# Patient Record
Sex: Female | Born: 1985 | Race: Black or African American | Hispanic: No | Marital: Single | Smoking: Never smoker
Health system: Southern US, Community
[De-identification: ages and names within clinical notes are randomized; demographics above are authoritative.]

## PROBLEM LIST (undated history)

## (undated) DIAGNOSIS — L309 Dermatitis, unspecified: Secondary | ICD-10-CM

## (undated) DIAGNOSIS — F419 Anxiety disorder, unspecified: Secondary | ICD-10-CM

## (undated) HISTORY — PX: NO PAST SURGERIES: SHX2092

## (undated) HISTORY — DX: Dermatitis, unspecified: L30.9

---

## 2009-01-14 ENCOUNTER — Inpatient Hospital Stay (HOSPITAL_COMMUNITY): Admission: AD | Admit: 2009-01-14 | Discharge: 2009-01-17 | Payer: Self-pay | Admitting: Obstetrics and Gynecology

## 2009-01-15 ENCOUNTER — Encounter (INDEPENDENT_AMBULATORY_CARE_PROVIDER_SITE_OTHER): Payer: Self-pay | Admitting: Obstetrics

## 2010-10-09 LAB — CBC
HCT: 34.4 % — ABNORMAL LOW (ref 36.0–46.0)
Hemoglobin: 10.7 g/dL — ABNORMAL LOW (ref 12.0–15.0)
Hemoglobin: 8.9 g/dL — ABNORMAL LOW (ref 12.0–15.0)
MCV: 74.6 fL — ABNORMAL LOW (ref 78.0–100.0)
Platelets: 256 10*3/uL (ref 150–400)
RBC: 3.62 MIL/uL — ABNORMAL LOW (ref 3.87–5.11)
RDW: 17.1 % — ABNORMAL HIGH (ref 11.5–15.5)
WBC: 17.2 10*3/uL — ABNORMAL HIGH (ref 4.0–10.5)
WBC: 22.1 10*3/uL — ABNORMAL HIGH (ref 4.0–10.5)

## 2015-01-06 ENCOUNTER — Emergency Department (HOSPITAL_COMMUNITY)
Admission: EM | Admit: 2015-01-06 | Discharge: 2015-01-06 | Disposition: A | Discharge status: Home / Self Care | Payer: BLUE CROSS/BLUE SHIELD | Attending: Emergency Medicine | Admitting: Emergency Medicine

## 2015-01-06 DIAGNOSIS — R21 Rash and other nonspecific skin eruption: Secondary | ICD-10-CM | POA: Diagnosis present

## 2015-01-06 DIAGNOSIS — L259 Unspecified contact dermatitis, unspecified cause: Secondary | ICD-10-CM | POA: Insufficient documentation

## 2015-01-06 MED ORDER — PREDNISONE 10 MG PO TABS: ORAL_TABLET | ORAL | Status: DC

## 2015-01-06 NOTE — Discharge Instructions (Signed)
Contact Dermatitis °Contact dermatitis is a rash that happens when something touches the skin. You touched something that irritates your skin, or you have allergies to something you touched. °HOME CARE  °· Avoid the thing that caused your rash. °· Keep your rash away from hot water, soap, sunlight, chemicals, and other things that might bother it. °· Do not scratch your rash. °· You can take cool baths to help stop itching. °· Only take medicine as told by your doctor. °· Keep all doctor visits as told. °GET HELP RIGHT AWAY IF:  °· Your rash is not better after 3 days. °· Your rash gets worse. °· Your rash is puffy (swollen), tender, red, sore, or warm. °· You have problems with your medicine. °MAKE SURE YOU:  °· Understand these instructions. °· Will watch your condition. °· Will get help right away if you are not doing well or get worse. °Document Released: 04/16/2009 Document Revised: 09/11/2011 Document Reviewed: 11/22/2010 °ExitCare® Patient Information ©2015 [employer]. This information is not intended to replace advice given to you by your health care provider. Make sure you discuss any questions you have with your health care provider. ° °

## 2015-01-06 NOTE — ED Notes (Signed)
Pt c/o bumps on skin onset last Thursday, itching started in past 24 hours. Pt states that the rash began on her forehead, spread to her face, chest, and back, and are now on her hands as well. Pt states she attempted treatment with an oatmeal shower and 2 benadryl pills last night and hydrocortisone cream today.

## 2015-01-06 NOTE — ED Notes (Signed)
Pt escorted to discharge window. Pt verbalized understanding discharge instructions. In no acute distress.  

## 2015-01-06 NOTE — ED Provider Notes (Signed)
CSN: [id number]     Arrival date & time 01/06/15  1419 History  This chart was scribed for non-physician practitioner, Teressa LowerVrinda Hilary Milks, NP, working with Raeford RazorStephen Kohut, MD by Charline BillsEssence Howell, ED Scribe. This patient was seen in room WTR5/WTR5 and the patient's care was started at 2:46 PM.   Chief Complaint  Patient presents with  . Allergic Reaction   The history is provided by the patient. No language interpreter was used.   HPI Comments: Molly LucksJessica Delossantos is a 29 y.o. female who presents to the Emergency Department complaining of a gradually improving rash to her face, chest, back and fingers for the past 6 days. Pt states that rash started on her forehead 6 days ago but did not become pruritic until yesterday. She reports using a new makeup spray 7-8 days ago. Pt has tried cleansing her face with [place], 2 Benadryl tablets last night and hydrocortisone cream with mild relief. No h/o DM.  No past medical history on file. No past surgical history on file. No family history on file. History  Substance Use Topics  . Smoking status: Not on file  . Smokeless tobacco: Not on file  . Alcohol Use: Not on file   OB History    No data available     Review of Systems  Skin: Positive for rash.  All other systems reviewed and are negative.  Allergies  Review of patient's allergies indicates not on file.  Home Medications   Prior to Admission medications   Not on File   BP 117/62 mmHg  Pulse 76  Temp(Src) 98.3 F (36.8 C) (Oral)  Resp 18  SpO2 100% Physical Exam  Constitutional: She is oriented to person, place, and time. She appears well-developed and well-nourished. No distress.  HENT:  Head: Normocephalic and atraumatic.  Mouth/Throat: Oropharynx is clear and moist and mucous membranes are normal.  No oral mucosal swelling.  Eyes: Conjunctivae and EOM are normal.  Neck: Neck supple. No tracheal deviation present.  Cardiovascular: Normal rate.   Pulmonary/Chest: Effort normal  and breath sounds normal. No respiratory distress.  Lungs are clear to auscultation.   Musculoskeletal: Normal range of motion.  Neurological: She is alert and oriented to person, place, and time.  Skin: Skin is warm and dry. Rash noted. Rash is macular and papular.  Macular, papular rash to face, neck and chest.  Psychiatric: She has a normal mood and affect. Her behavior is normal.  Nursing note and vitals reviewed.  ED Course  Procedures (including critical care time) DIAGNOSTIC STUDIES: Oxygen Saturation is 100% on RA, normal by my interpretation.    COORDINATION OF CARE: 2:49 PM-Discussed treatment plan which includes Prednisone with pt at bedside and pt agreed to plan.   Labs Review Labs Reviewed - No data to display  Imaging Review No results found.   EKG Interpretation None      MDM   Final diagnoses:  Contact dermatitis    Will treat with prednisone. No oral swelling or breathing difficulty. Pt given return precautions  I personally performed the services described in this documentation, which was scribed in my presence. The recorded information has been reviewed and is accurate.    Teressa LowerVrinda Dayanara Sherrill, NP 01/06/15 1457  Raeford RazorStephen Kohut, MD 01/07/15 [phone]

## 2016-03-10 LAB — CYTOLOGY - PAP

## 2016-03-16 ENCOUNTER — Ambulatory Visit: Payer: Self-pay | Admitting: Obstetrics & Gynecology

## 2016-03-30 ENCOUNTER — Encounter: Payer: Self-pay | Admitting: Obstetrics and Gynecology

## 2016-03-30 ENCOUNTER — Ambulatory Visit (INDEPENDENT_AMBULATORY_CARE_PROVIDER_SITE_OTHER): Payer: 59 | Admitting: Obstetrics and Gynecology

## 2016-03-30 VITALS — BP 108/65 | HR 92 | Temp 98.6°F | Wt 153.2 lb

## 2016-03-30 DIAGNOSIS — Z01419 Encounter for gynecological examination (general) (routine) without abnormal findings: Secondary | ICD-10-CM

## 2016-03-30 DIAGNOSIS — Z3049 Encounter for surveillance of other contraceptives: Secondary | ICD-10-CM | POA: Diagnosis not present

## 2016-03-30 DIAGNOSIS — Z30011 Encounter for initial prescription of contraceptive pills: Secondary | ICD-10-CM

## 2016-03-30 MED ORDER — NORETHIN ACE-ETH ESTRAD-FE 1-20 MG-MCG(24) PO TABS: 1.0000 | ORAL_TABLET | Freq: Every day | ORAL | 12 refills | Status: DC

## 2016-03-30 NOTE — Progress Notes (Signed)
Subjective:     Molly LucksJessica Dawson is a 30 y.o. female who is here for a comprehensive physical exam. The patient reports no problems. Patient is sexually active using OCP for contraception. She denies any abnormal vaginal bleeding or discharge  No past medical history on file. No past surgical history on file. No family history on file.  Social History   Social History  . Marital status: Single    Spouse name: N/A  . Number of children: N/A  . Years of education: N/A   Occupational History  . Not on file.   Social History Main Topics  . Smoking status: Never Smoker  . Smokeless tobacco: Not on file  . Alcohol use 0.6 oz/week    1 Glasses of wine per week  . Drug use: No  . Sexual activity: Yes    Partners: Female, Female    Birth control/ protection: Pill   Other Topics Concern  . Not on file   Social History Narrative  . No narrative on file   Health Maintenance  Topic Date Due  . HIV Screening  10/22/2001  . TETANUS/TDAP  10/22/2005  . INFLUENZA VACCINE  02/01/2016  . PAP SMEAR  04/12/2018       Review of Systems Pertinent items are noted in HPI.   Objective:  Blood pressure 108/65, pulse 92, temperature 98.6 F (37 C), temperature source Oral, weight 153 lb 3.2 oz (69.5 kg).     GENERAL: Well-developed, well-nourished female in no acute distress.  HEENT: Normocephalic, atraumatic. Sclerae anicteric.  NECK: Supple. Normal thyroid.  LUNGS: Clear to auscultation bilaterally.  HEART: Regular rate and rhythm. BREASTS: Symmetric in size. No palpable masses or lymphadenopathy, skin changes, or nipple drainage. ABDOMEN: Soft, nontender, nondistended. No organomegaly. PELVIC: Normal external female genitalia. Vagina is pink and rugated.  Normal discharge. Normal appearing cervix. Uterus is normal in size. No adnexal mass or tenderness. EXTREMITIES: No cyanosis, clubbing, or edema, 2+ distal pulses.    Assessment:    Healthy female exam.      Plan:    pap  smear collected Patient desires full STD testing which was performed today Refill on OCP provided Patient will be contacted with abnormal results See After Visit Summary for Counseling Recommendations

## 2016-03-30 NOTE — Progress Notes (Signed)
Patient is in office for initial gyn visit, states that she wants to continue taking Lomedia birth control pills.

## 2016-03-31 ENCOUNTER — Encounter: Payer: Self-pay | Admitting: Obstetrics and Gynecology

## 2016-03-31 LAB — HIV ANTIBODY (ROUTINE TESTING W REFLEX): HIV SCREEN 4TH GENERATION: NONREACTIVE

## 2016-03-31 LAB — RPR: RPR: NONREACTIVE

## 2016-03-31 LAB — HEPATITIS C ANTIBODY: Hep C Virus Ab: <0.1 s/co ratio (ref 0.0–0.9)

## 2016-04-03 LAB — GC/CHLAMYDIA PROBE AMP
CHLAMYDIA, DNA PROBE: NEGATIVE
NEISSERIA GONORRHOEAE BY PCR: NEGATIVE

## 2016-04-05 LAB — PAP IG W/ RFLX HPV ASCU: PAP SMEAR COMMENT: 0

## 2016-04-17 ENCOUNTER — Other Ambulatory Visit: Payer: Self-pay | Admitting: *Deleted

## 2016-04-17 DIAGNOSIS — Z3041 Encounter for surveillance of contraceptive pills: Secondary | ICD-10-CM

## 2016-04-17 MED ORDER — NORETHIN-ETH ESTRAD-FE BIPHAS 1 MG-10 MCG / 10 MCG PO TABS: 1.0000 | ORAL_TABLET | Freq: Every day | ORAL | 4 refills | Status: DC

## 2016-04-17 NOTE — Progress Notes (Unsigned)
loloestrin

## 2016-06-08 ENCOUNTER — Encounter (HOSPITAL_COMMUNITY): Payer: Self-pay | Admitting: Family Medicine

## 2016-06-08 ENCOUNTER — Ambulatory Visit (HOSPITAL_COMMUNITY)
Admission: EM | Admit: 2016-06-08 | Discharge: 2016-06-08 | Disposition: A | Discharge status: Home / Self Care | Payer: 59 | Attending: Family Medicine | Admitting: Family Medicine

## 2016-06-08 DIAGNOSIS — J0100 Acute maxillary sinusitis, unspecified: Secondary | ICD-10-CM

## 2016-06-08 MED ORDER — AMOXICILLIN 875 MG PO TABS
875.0000 mg | ORAL_TABLET | Freq: Two times a day (BID) | ORAL | 0 refills | Status: DC
Start: 2016-06-08 — End: 2017-01-04

## 2016-06-08 MED ORDER — FLUTICASONE PROPIONATE 50 MCG/ACT NA SUSP: 1.0000 | Freq: Every day | NASAL | 2 refills | Status: DC

## 2016-06-08 NOTE — ED Provider Notes (Signed)
[facility]    CSN: [id number] Arrival date & time: 06/08/16  1013     History   Chief Complaint Chief Complaint  Patient presents with  . Nasal Congestion    HPI Molly Dawson is a 30 y.o. female.   This is 30 year old woman comes in for evaluation of sinus congestion. She's had 3 weeks of symptoms which have gotten worse over the last 3 days. Patient has had no fever or cough. She does have a dry mouth sometimes.  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      History reviewed. No pertinent past medical history.  There are no active problems to display for this patient.   History reviewed. No pertinent surgical history.  OB History    No data available       Home Medications    Prior to Admission medications   Medication Sig Start Date End Date Taking? Authorizing Provider  amoxicillin (AMOXIL) 875 MG tablet Take 1 tablet (875 mg total) by mouth 2 (two) times daily. Take with food 06/08/16   Elvina SidleKurt Daviona Herbert, MD  fluticasone [facility](FLONASE) 50 MCG/ACT nasal spray Place 1 spray into both nostrils daily. 06/08/16   Elvina SidleKurt Kaylan Friedmann, MD  Norethindrone Acetate-Ethinyl Estrad-FE (LOMEDIA 24 FE) 1-20 MG-MCG(24) tablet Take 1 tablet by mouth daily. 03/30/16   Peggy Constant, MD  Norethindrone-Ethinyl Estradiol-Fe Biphas (LO LOESTRIN FE) 1 MG-10 MCG / 10 MCG tablet Take 1 tablet by mouth daily. 04/17/16   Roe Coombsachelle A Denney, CNM    Family History History reviewed. No pertinent family history.  Social History Social History  Substance Use Topics  . Smoking status: Never Smoker  . Smokeless tobacco: Never Used  . Alcohol use 0.6 oz/week    1 Glasses of wine per week     Allergies   Patient has no known allergies.   Review of Systems Review of Systems  Constitutional: Negative.   HENT: Positive for congestion, rhinorrhea, sinus pain, sinus pressure, sneezing and sore throat.   Respiratory: Negative.   Cardiovascular: Negative.     Musculoskeletal: Negative.   Neurological: Negative.      Physical Exam Triage Vital Signs ED Triage Vitals [06/08/16 1103]  Enc Vitals Group     BP 114/74     Pulse Rate 85     Resp 18     Temp 98.3 F (36.8 C)     Temp Source Oral     SpO2 100 %     Weight      Height      Head Circumference      Peak Flow      Pain Score      Pain Loc      Pain Edu?      Excl. in GC?    No data found.   Updated Vital Signs BP 114/74   Pulse 85   Temp 98.3 F (36.8 C) (Oral)   Resp 18   SpO2 100%    Physical Exam  Constitutional: She is oriented to person, place, and time. She appears well-developed and well-nourished.  HENT:  Head: Normocephalic.  Right Ear: External ear normal.  Left Ear: External ear normal.  Mouth/Throat: Oropharynx is clear and moist.  Eyes: Conjunctivae and EOM are normal.  Neck: Normal range of motion. Neck supple.  Pulmonary/Chest: Effort normal.  Musculoskeletal: Normal range of motion.  Neurological: She is alert and oriented to person, place, and time.  Skin: Skin is warm and  dry.  Nursing note and vitals reviewed.    UC Treatments / Results  Labs (all labs ordered are listed, but only abnormal results are displayed) Labs Reviewed - No data to display  EKG  EKG Interpretation None       Radiology No results found.  Procedures Procedures (including critical care time)  Medications Ordered in UC Medications - No data to display   Initial Impression / Assessment and Plan / UC Course  I have reviewed the triage vital signs and the nursing notes.  Pertinent labs & imaging results that were available during my care of the patient were reviewed by me and considered in my medical decision making (see chart for details).  Clinical Course     Final Clinical Impressions(s) / UC Diagnoses   Final diagnoses:  Acute maxillary sinusitis, recurrence not specified    New Prescriptions New Prescriptions   AMOXICILLIN (AMOXIL)  875 MG TABLET    Take 1 tablet (875 mg total) by mouth 2 (two) times daily. Take with food   FLUTICASONE (FLONASE) 50 MCG/ACT NASAL SPRAY    Place 1 spray into both nostrils daily.     Elvina SidleKurt Herschel Fleagle, MD 06/08/16 1124

## 2016-06-08 NOTE — ED Triage Notes (Signed)
Pt here for severe nasal congestion. sts that she has been having symptoms for 3 weeks but started worsening Monday.

## 2016-06-08 NOTE — Discharge Instructions (Signed)
You may use Afrin (or generic oxymetazoline) nasal spray for 2-3 days to get more immediate relief

## 2016-06-09 NOTE — Progress Notes (Deleted)
Discharge instructions, RX's and follow up appts explained and provided to patient, verbalized understanding. Patient left floor via wheelchair accompanied by staff no c/o pain or shortness of breath.  Curley Hogen, Kae HellerMiranda Lynn, RN

## 2016-07-13 ENCOUNTER — Other Ambulatory Visit: Payer: Self-pay | Admitting: *Deleted

## 2016-07-13 DIAGNOSIS — Z308 Encounter for other contraceptive management: Secondary | ICD-10-CM

## 2016-07-13 MED ORDER — NORETHIN ACE-ETH ESTRAD-FE 1-20 MG-MCG PO TABS: 1.0000 | ORAL_TABLET | Freq: Every day | ORAL | 11 refills | Status: DC

## 2016-07-13 NOTE — Progress Notes (Unsigned)
Pt called to office needing to change her BC to Junel 1-20. Pt states that her insurance has an increased copay for current Rx. BC was changed to Junel per Dr Clearance CootsHarper approval.

## 2016-08-23 ENCOUNTER — Encounter (HOSPITAL_COMMUNITY): Payer: Self-pay | Admitting: Emergency Medicine

## 2016-08-23 ENCOUNTER — Ambulatory Visit (HOSPITAL_COMMUNITY)
Admission: EM | Admit: 2016-08-23 | Discharge: 2016-08-23 | Disposition: A | Discharge status: Home / Self Care | Payer: 59 | Attending: Family Medicine | Admitting: Family Medicine

## 2016-08-23 DIAGNOSIS — J0100 Acute maxillary sinusitis, unspecified: Secondary | ICD-10-CM

## 2016-08-23 MED ORDER — IPRATROPIUM BROMIDE 0.06 % NA SOLN: 2.0000 | Freq: Four times a day (QID) | NASAL | 0 refills | Status: DC

## 2016-08-23 MED ORDER — AMOXICILLIN 875 MG PO TABS: 875.0000 mg | ORAL_TABLET | Freq: Two times a day (BID) | ORAL | 0 refills | Status: DC

## 2016-08-23 NOTE — ED Triage Notes (Signed)
Pt reports sinus congestion and drainage since Sunday.  She reports facial pain and a slight headache.

## 2016-08-23 NOTE — ED Provider Notes (Signed)
CSN: [id number]     Arrival date & time 08/23/16  1038 History   None    Chief Complaint  Patient presents with  . Recurrent Sinusitis   (Consider location/radiation/quality/duration/timing/severity/associated sxs/prior Treatment) Patient c/o sinus drainage fever and congestion for 3 days   The history is provided by the patient.  URI  Presenting symptoms: congestion, cough, fatigue and fever   Severity:  Moderate Onset quality:  Sudden Duration:  3 days Progression:  Worsening Chronicity:  New Relieved by:  None tried Worsened by:  Nothing Ineffective treatments:  None tried Associated symptoms: headaches     History reviewed. No pertinent past medical history. History reviewed. No pertinent surgical history. History reviewed. No pertinent family history. Social History  Substance Use Topics  . Smoking status: Never Smoker  . Smokeless tobacco: Never Used  . Alcohol use 0.6 oz/week    1 Glasses of wine per week   OB History    No data available     Review of Systems  Constitutional: Positive for fatigue and fever.  HENT: Positive for congestion.   Eyes: Negative.   Respiratory: Positive for cough.   Cardiovascular: Negative.   Gastrointestinal: Negative.   Endocrine: Negative.   Genitourinary: Negative.   Musculoskeletal: Negative.   Allergic/Immunologic: Negative.   Neurological: Positive for headaches.  Hematological: Negative.   Psychiatric/Behavioral: Negative.     Allergies  Patient has no known allergies.  Home Medications   Prior to Admission medications   Medication Sig Start Date End Date Taking? Authorizing Provider  norethindrone-ethinyl estradiol (JUNEL FE 1/20) 1-20 MG-MCG tablet Take 1 tablet by mouth daily. 07/13/16  Yes Brock Badharles A Harper, MD  amoxicillin (AMOXIL) 875 MG tablet Take 1 tablet (875 mg total) by mouth 2 (two) times daily. Take with food 06/08/16   Elvina SidleKurt Lauenstein, MD  amoxicillin (AMOXIL) 875 MG tablet Take 1 tablet (875 mg  total) by mouth 2 (two) times daily. 08/23/16   Deatra CanterWilliam J Oxford, FNP  fluticasone (FLONASE) 50 MCG/ACT nasal spray Place 1 spray into both nostrils daily. 06/08/16   Elvina SidleKurt Lauenstein, MD  ipratropium (ATROVENT) 0.06 % nasal spray Place 2 sprays into both nostrils 4 (four) times daily. 08/23/16   Deatra CanterWilliam J Oxford, FNP  ipratropium (ATROVENT) 0.06 % nasal spray Place 2 sprays into both nostrils 4 (four) times daily. 08/23/16   Deatra CanterWilliam J Oxford, FNP  Norethindrone-Ethinyl Estradiol-Fe Biphas (LO LOESTRIN FE) 1 MG-10 MCG / 10 MCG tablet Take 1 tablet by mouth daily. 04/17/16   Roe Coombsachelle A Denney, CNM   Meds Ordered and Administered this Visit  Medications - No data to display  BP 111/69 (BP Location: Right Arm)   Pulse 82   Temp 98.1 F (36.7 C) (Oral)   SpO2 100%  No data found.   Physical Exam  Constitutional: She appears well-developed and well-nourished.  HENT:  Head: Normocephalic and atraumatic.  Right Ear: External ear normal.  Left Ear: External ear normal.  Mouth/Throat: Oropharynx is clear and moist.  Eyes: Conjunctivae and EOM are normal. Pupils are equal, round, and reactive to light.  Neck: Normal range of motion. Neck supple.  Cardiovascular: Normal rate, regular rhythm and normal heart sounds.   Pulmonary/Chest: Effort normal and breath sounds normal.  Nursing note and vitals reviewed.   Urgent Care Course     Procedures (including critical care time)  Labs Review Labs Reviewed - No data to display  Imaging Review No results found.   Visual Acuity Review  Right Eye Distance:  Left Eye Distance:   Bilateral Distance:    Right Eye Near:   Left Eye Near:    Bilateral Near:         MDM   1. Subacute maxillary sinusitis    Amoxicillin 875mg  one po bid x10 days #20 Atrovent Nasal Spray  Push po fluids, rest, tylenol and motrin otc prn as directed for fever, arthralgias, and myalgias.  Follow up prn if sx's continue or persist.   Deatra Canter,  FNP 08/23/16 1336

## 2016-09-12 ENCOUNTER — Ambulatory Visit: Payer: BLUE CROSS/BLUE SHIELD | Admitting: Family

## 2017-01-04 ENCOUNTER — Other Ambulatory Visit (HOSPITAL_COMMUNITY)
Admission: RE | Admit: 2017-01-04 | Discharge: 2017-01-04 | Disposition: A | Discharge status: Home / Self Care | Payer: 59 | Source: Ambulatory Visit | Attending: Advanced Practice Midwife | Admitting: Advanced Practice Midwife

## 2017-01-04 ENCOUNTER — Ambulatory Visit (INDEPENDENT_AMBULATORY_CARE_PROVIDER_SITE_OTHER): Payer: 59 | Admitting: Advanced Practice Midwife

## 2017-01-04 ENCOUNTER — Encounter: Payer: Self-pay | Admitting: Advanced Practice Midwife

## 2017-01-04 DIAGNOSIS — N762 Acute vulvitis: Secondary | ICD-10-CM | POA: Diagnosis not present

## 2017-01-04 DIAGNOSIS — Z113 Encounter for screening for infections with a predominantly sexual mode of transmission: Secondary | ICD-10-CM

## 2017-01-04 DIAGNOSIS — N9089 Other specified noninflammatory disorders of vulva and perineum: Secondary | ICD-10-CM | POA: Insufficient documentation

## 2017-01-04 MED ORDER — VALACYCLOVIR HCL 1 G PO TABS
1000.0000 mg | ORAL_TABLET | Freq: Two times a day (BID) | ORAL | 3 refills | Status: DC
Start: 2017-01-04 — End: 2017-07-26

## 2017-01-04 NOTE — Progress Notes (Signed)
Patient is in the office- she states she was having vaginal discomfort after intercourse. Patient reports internal pain- "on edge of introitis". Patient states it has improved over time.

## 2017-01-04 NOTE — Progress Notes (Signed)
   Subjective:    Patient ID: Molly Dawson, female    DOB: [birth year], 31 y.o.   MRN: [id number]  This is a 31 y.o. female who presents with c/o irritation and burning on outside edge of vulva at introitus for about a week. States it has improved over the past few days, and does not burn nearly as much.  Has never had this happen before.  Does have a new partner but states "we can do testing" but is not too worried.  Gynecologic Exam  The patient's primary symptoms include genital lesions. The patient's pertinent negatives include no genital itching, genital odor, pelvic pain or vaginal bleeding. This is a new problem. The current episode started in the past 7 days. The problem occurs constantly. The problem has been gradually improving. The pain is mild. The problem affects both sides. She is not pregnant. Pertinent negatives include no abdominal pain, back pain, chills, constipation, diarrhea, dysuria, fever, headaches or nausea. The symptoms are aggravated by tactile pressure. She has tried nothing for the symptoms. She uses oral contraceptives for contraception.    Review of Systems  Constitutional: Negative for chills and fever.  Gastrointestinal: Negative for abdominal pain, constipation, diarrhea and nausea.  Genitourinary: Negative for dysuria and pelvic pain.  Musculoskeletal: Negative for back pain.  Neurological: Negative for headaches.       Objective:   Physical Exam  Constitutional: She is oriented to person, place, and time. She appears well-developed and well-nourished. No distress.  HENT:  Head: Normocephalic.  Cardiovascular: Normal rate and regular rhythm.   Pulmonary/Chest: Effort normal and breath sounds normal.  Abdominal: Soft. She exhibits no distension. There is no tenderness. There is no rebound and no guarding.  Genitourinary:  Genitourinary Comments: At the introitus, just inside (external to hymenal ring) there is an area at 7:00 which has three  vesicle-appearing lesions.  Slightly tender to tactile pressure.  HSV cultures obtained.  Musculoskeletal: Normal range of motion. She exhibits no edema.  Neurological: She is alert and oriented to person, place, and time.  Skin: Skin is warm and dry.  Psychiatric: She has a normal mood and affect.          Assessment & Plan:  A:   Vulvar irritation        Vulvar lesions  P:   Sent cultures, wet prep and HSV cultures       Rx Valtrex to start now just in case.       Followup as needed

## 2017-01-04 NOTE — Patient Instructions (Signed)
Safe Sex Practicing safe sex means taking steps before and during sex to reduce your risk of:  Getting an STD (sexually transmitted disease).  Giving your partner an STD.  Unwanted pregnancy.  How can I practice safe sex?  To practice safe sex:  Limit your sexual partners to only one partner who is having sex with only you.  Avoid using alcohol and recreational drugs before having sex. These substances can affect your judgment.  Before having sex with a new partner: ? Talk to your partner about past partners, past STDs, and drug use. ? You and your partner should be screened for STDs and discuss the results with each other.  Check your body regularly for sores, blisters, rashes, or unusual discharge. If you notice any of these problems, visit your health care provider.  If you have symptoms of an infection or you are being treated for an STD, avoid sexual contact.  While having sex, use a condom. Make sure to: ? Use a condom every time you have vaginal, oral, or anal sex. Both females and males should wear condoms during oral sex. ? Keep condoms in place from the beginning to the end of sexual activity. ? Use a latex condom, if possible. Latex condoms offer the best protection. ? Use only water-based lubricants or oils to lubricate a condom. Using petroleum-based lubricants or oils will weaken the condom and increase the chance that it will break.  See your health care provider for regular screenings, exams, and tests for STDs.  Talk with your health care provider about the form of birth control (contraception) that is best for you.  Get vaccinated against hepatitis B and human papillomavirus (HPV).  If you are at risk of being infected with HIV (human immunodeficiency virus), talk with your health care provider about taking a prescription medicine to prevent HIV infection. You are considered at risk for HIV if: ? You are a man who has sex with other men. ? You are a  heterosexual man or woman who is sexually active with more than one partner. ? You take drugs by injection. ? You are sexually active with a partner who has HIV.  This information is not intended to replace advice given to you by your health care provider. Make sure you discuss any questions you have with your health care provider. Document Released: 07/27/2004 Document Revised: 11/03/2015 Document Reviewed: 05/09/2015 Elsevier Interactive Patient Education  2018 [employer]. Sexually Transmitted Disease A sexually transmitted disease (STD) is a disease or infection that may be passed (transmitted) from person to person, usually during sexual activity. This may happen by way of saliva, semen, blood, vaginal mucus, or urine. Common STDs include:  Gonorrhea.  Chlamydia.  Syphilis.  HIV and AIDS.  Genital herpes.  Hepatitis B and C.  Trichomonas.  Human papillomavirus (HPV).  Pubic lice.  Scabies.  Mites.  Bacterial vaginosis.  What are the causes? An STD may be caused by bacteria, a virus, or parasites. STDs are often transmitted during sexual activity if one person is infected. However, they may also be transmitted through nonsexual means. STDs may be transmitted after:  Sexual intercourse with an infected person.  Sharing sex toys with an infected person.  Sharing needles with an infected person or using unclean piercing or tattoo needles.  Having intimate contact with the genitals, mouth, or rectal areas of an infected person.  Exposure to infected fluids during birth.  What are the signs or symptoms? Different STDs have different symptoms.  Some people may not have any symptoms. If symptoms are present, they may include:  Painful or bloody urination.  Pain in the pelvis, abdomen, vagina, anus, throat, or eyes.  A skin rash, itching, or irritation.  Growths, ulcerations, blisters, or sores in the genital and anal areas.  Abnormal vaginal discharge with or  without bad odor.  Penile discharge in men.  Fever.  Pain or bleeding during sexual intercourse.  Swollen glands in the groin area.  Yellow skin and eyes (jaundice). This is seen with hepatitis.  Swollen testicles.  Infertility.  Sores and blisters in the mouth.  How is this diagnosed? To make a diagnosis, your health care provider may:  Take a medical history.  Perform a physical exam.  Take a sample of any discharge to examine.  Swab the throat, cervix, opening to the penis, rectum, or vagina for testing.  Test a sample of your first morning urine.  Perform blood tests.  Perform a Pap test, if this applies.  Perform a colposcopy.  Perform a laparoscopy.  How is this treated? Treatment depends on the STD. Some STDs may be treated but not cured.  Chlamydia, gonorrhea, trichomonas, and syphilis can be cured with antibiotic medicine.  Genital herpes, hepatitis, and HIV can be treated, but not cured, with prescribed medicines. The medicines lessen symptoms.  Genital warts from HPV can be treated with medicine or by freezing, burning (electrocautery), or surgery. Warts may come back.  HPV cannot be cured with medicine or surgery. However, abnormal areas may be removed from the cervix, vagina, or vulva.  If your diagnosis is confirmed, your recent sexual partners need treatment. This is true even if they are symptom-free or have a negative culture or evaluation. They should not have sex until their health care providers say it is okay.  Your health care provider may test you for infection again 3 months after treatment.  How is this prevented? Take these steps to reduce your risk of getting an STD:  Use latex condoms, dental dams, and water-soluble lubricants during sexual activity. Do not use petroleum jelly or oils.  Avoid having multiple sex partners.  Do not have sex with someone who has other sex partners.  Do not have sex with anyone you do not know or  who is at high risk for an STD.  Avoid risky sex practices that can break your skin.  Do not have sex if you have open sores on your mouth or skin.  Avoid drinking too much alcohol or taking illegal drugs. Alcohol and drugs can affect your judgment and put you in a vulnerable position.  Avoid engaging in oral and anal sex acts.  Get vaccinated for HPV and hepatitis. If you have not received these vaccines in the past, talk to your health care provider about whether one or both might be right for you.  If you are at risk of being infected with HIV, it is recommended that you take a prescription medicine daily to prevent HIV infection. This is called pre-exposure prophylaxis (PrEP). You are considered at risk if: ? You are a man who has sex with other men (MSM). ? You are a heterosexual man or woman and are sexually active with more than one partner. ? You take drugs by injection. ? You are sexually active with a partner who has HIV.  Talk with your health care provider about whether you are at high risk of being infected with HIV. If you choose to begin PrEP, you  should first be tested for HIV. You should then be tested every 3 months for as long as you are taking PrEP.  Contact a health care provider if:  See your health care provider.  Tell your sexual partner(s). They should be tested and treated for any STDs.  Do not have sex until your health care provider says it is okay. Get help right away if: Contact your health care provider right away if:  You have severe abdominal pain.  You are a man and notice swelling or pain in your testicles.  You are a woman and notice swelling or pain in your vagina.  This information is not intended to replace advice given to you by your health care provider. Make sure you discuss any questions you have with your health care provider. Document Released: 09/09/2002 Document Revised: 01/07/2016 Document Reviewed: 01/07/2013 Elsevier Interactive  Patient Education  2018 [employer]

## 2017-01-05 LAB — CERVICOVAGINAL ANCILLARY ONLY
BACTERIAL VAGINITIS: POSITIVE — AB
Candida vaginitis: NEGATIVE
Chlamydia: NEGATIVE
Neisseria Gonorrhea: NEGATIVE
TRICH (WINDOWPATH): NEGATIVE

## 2017-01-06 ENCOUNTER — Other Ambulatory Visit: Payer: Self-pay | Admitting: Advanced Practice Midwife

## 2017-01-06 MED ORDER — METRONIDAZOLE 500 MG PO TABS: 500.0000 mg | ORAL_TABLET | Freq: Two times a day (BID) | ORAL | 0 refills | Status: AC

## 2017-01-06 NOTE — Progress Notes (Unsigned)
BV seen on testing Rx sent for Flagyl

## 2017-01-07 LAB — HERPES SIMPLEX VIRUS CULTURE

## 2017-01-15 ENCOUNTER — Encounter: Payer: Self-pay | Admitting: *Deleted

## 2017-04-18 ENCOUNTER — Ambulatory Visit: Payer: 59 | Admitting: Obstetrics and Gynecology

## 2017-05-07 ENCOUNTER — Other Ambulatory Visit: Payer: Self-pay | Admitting: *Deleted

## 2017-05-07 DIAGNOSIS — Z3041 Encounter for surveillance of contraceptive pills: Secondary | ICD-10-CM

## 2017-05-07 MED ORDER — NORETHIN-ETH ESTRAD-FE BIPHAS 1 MG-10 MCG / 10 MCG PO TABS: 1.0000 | ORAL_TABLET | Freq: Every day | ORAL | 4 refills | Status: DC

## 2017-05-07 NOTE — Progress Notes (Signed)
[facility] that is scheduled for tomorrow. BC reordered to pharmacy, pt advised to keep appt for AEX in order to continue Rx.

## 2017-05-08 ENCOUNTER — Ambulatory Visit: Payer: 59 | Admitting: Obstetrics and Gynecology

## 2017-05-15 ENCOUNTER — Ambulatory Visit (INDEPENDENT_AMBULATORY_CARE_PROVIDER_SITE_OTHER): Payer: 59 | Admitting: Certified Nurse Midwife

## 2017-05-15 ENCOUNTER — Other Ambulatory Visit (HOSPITAL_COMMUNITY)
Admission: RE | Admit: 2017-05-15 | Discharge: 2017-05-15 | Disposition: A | Discharge status: Home / Self Care | Payer: 59 | Source: Ambulatory Visit | Attending: Certified Nurse Midwife | Admitting: Certified Nurse Midwife

## 2017-05-15 ENCOUNTER — Other Ambulatory Visit: Payer: Self-pay

## 2017-05-15 ENCOUNTER — Encounter: Payer: Self-pay | Admitting: Certified Nurse Midwife

## 2017-05-15 VITALS — BP 107/73 | HR 79 | Ht 63.0 in | Wt 159.7 lb

## 2017-05-15 DIAGNOSIS — Z01419 Encounter for gynecological examination (general) (routine) without abnormal findings: Secondary | ICD-10-CM | POA: Insufficient documentation

## 2017-05-15 DIAGNOSIS — Z113 Encounter for screening for infections with a predominantly sexual mode of transmission: Secondary | ICD-10-CM

## 2017-05-15 DIAGNOSIS — Z1151 Encounter for screening for human papillomavirus (HPV): Secondary | ICD-10-CM

## 2017-05-15 DIAGNOSIS — Z889 Allergy status to unspecified drugs, medicaments and biological substances status: Secondary | ICD-10-CM

## 2017-05-15 DIAGNOSIS — Z3041 Encounter for surveillance of contraceptive pills: Secondary | ICD-10-CM

## 2017-05-15 DIAGNOSIS — N898 Other specified noninflammatory disorders of vagina: Secondary | ICD-10-CM

## 2017-05-15 DIAGNOSIS — Z124 Encounter for screening for malignant neoplasm of cervix: Secondary | ICD-10-CM

## 2017-05-15 DIAGNOSIS — Z9189 Other specified personal risk factors, not elsewhere classified: Secondary | ICD-10-CM

## 2017-05-15 MED ORDER — NORETHIN-ETH ESTRAD-FE BIPHAS 1 MG-10 MCG / 10 MCG PO TABS: 1.0000 | ORAL_TABLET | Freq: Every day | ORAL | 4 refills | Status: DC

## 2017-05-15 NOTE — Progress Notes (Signed)
AEX, wants PAP/STD.  Needs Rx refill on OCP

## 2017-05-15 NOTE — Progress Notes (Signed)
Subjective:        Molly Dawson is a 31 y.o. female here for a routine exam.  a comprehensive physical exam. The patient reports no problems. Patient is sexually active using OCP for contraception. She denies any abnormal vaginal bleeding.  Reports no periods with LoLo and has been on it for several years.  Denies any change in partners.  Desires full STD screening.  Reports seasonal allergies and takes Benedryl for them, did have sinusitis last year.     Personal health questionnaire:  Is patient Ashkenazi Jewish, have a family history of breast and/or ovarian cancer: no Is there a family history of uterine cancer diagnosed at age < 6650, gastrointestinal cancer, urinary tract cancer, family member who is a Personnel officerLynch syndrome-associated carrier: no Is the patient overweight and hypertensive, family history of diabetes, personal history of gestational diabetes, preeclampsia or PCOS: no Is patient over 655, have PCOS,  family history of premature CHD under age 30, diabetes, smoke, have hypertension or peripheral artery disease:  no At any time, has a partner hit, kicked or otherwise hurt or frightened you?: no Over the past 2 weeks, have you felt down, depressed or hopeless?: no Over the past 2 weeks, have you felt little interest or pleasure in doing things?:no   Gynecologic History No LMP recorded (lmp unknown). Patient is not currently having periods (Reason: Oral contraceptives). Contraception: OCP (estrogen/progesterone) Last Pap: 03/30/16. Results were: normal Last mammogram: n/a <40.  Obstetric History OB History  Gravida Para Term Preterm AB Living  1 1       1   SAB TAB Ectopic Multiple Live Births               # Outcome Date GA Lbr Len/2nd Weight Sex Delivery Anes PTL Lv  1 Para               No past medical history on file.  No past surgical history on file.   Current Outpatient Medications:  .  Norethindrone-Ethinyl Estradiol-Fe Biphas (LO LOESTRIN FE) 1 MG-10 MCG / 10  MCG tablet, Take 1 tablet daily by mouth., Disp: 3 Package, Rfl: 4 .  Norethindrone-Ethinyl Estradiol-Fe Biphas (LO LOESTRIN FE) 1 MG-10 MCG / 10 MCG tablet, Take 1 tablet daily by mouth. Take 1 tablet by mouth daily., Disp: 3 Package, Rfl: 4 .  prednisoLONE acetate (PRED FORTE) 1 % ophthalmic suspension, , Disp: , Rfl:  .  valACYclovir (VALTREX) 1000 MG tablet, Take 1 tablet (1,000 mg total) by mouth 2 (two) times daily. Take for ten days. (Patient not taking: Reported on 05/15/2017), Disp: 20 tablet, Rfl: 3 No Known Allergies  Social History   Tobacco Use  . Smoking status: Never Smoker  . Smokeless tobacco: Never Used  Substance Use Topics  . Alcohol use: Yes    Alcohol/week: 0.6 oz    Types: 1 Glasses of wine per week    No family history on file.    Review of Systems  Constitutional: negative for fatigue and weight loss Respiratory: negative for cough and wheezing Cardiovascular: negative for chest pain, fatigue and palpitations Gastrointestinal: negative for abdominal pain and change in bowel habits Musculoskeletal:negative for myalgias Neurological: negative for gait problems and tremors Behavioral/Psych: negative for abusive relationship, depression Endocrine: negative for temperature intolerance    Genitourinary:negative for abnormal menstrual periods, genital lesions, hot flashes, sexual problems and vaginal discharge Integument/breast: negative for breast lump, breast tenderness, nipple discharge and skin lesion(s)    Objective:  BP 107/73   Pulse 79   Ht 5\' 3"  (1.6 m)   Wt 159 lb 11.2 oz (72.4 kg)   LMP  (LMP Unknown)   BMI 28.29 kg/m  General:   alert  Skin:   no rash or abnormalities  Lungs:   clear to auscultation bilaterally  Heart:   regular rate and rhythm, S1, S2 normal, no murmur, click, rub or gallop  Breasts:   normal without suspicious masses, skin or nipple changes or axillary nodes  Abdomen:  normal findings: no organomegaly, soft,  non-tender and no hernia  Pelvis:  External genitalia: normal general appearance Urinary system: urethral meatus normal and bladder without fullness, nontender Vaginal: normal without tenderness, induration or masses Cervix: normal appearance Adnexa: normal bimanual exam Uterus: anteverted and non-tender, normal size   Lab Review Urine pregnancy test Labs reviewed yes Radiologic studies reviewed no  50% of 30 min visit spent on counseling and coordination of care.    Assessment & Plan    Healthy female exam.      Education reviewed: calcium supplements, depression evaluation, low fat, low cholesterol diet, safe sex/STD prevention, self breast exams, skin cancer screening and weight bearing exercise. Contraception: OCP (estrogen/progesterone). Follow up in: 1 year.   Meds ordered this encounter  Medications  . DISCONTD: Norethindrone-Ethinyl Estradiol-Fe Biphas (LO LOESTRIN FE) 1 MG-10 MCG / 10 MCG tablet    Sig: Take 1 tablet daily by mouth. Take 1 tablet by mouth daily.    Dispense:  3 Package    Refill:  4    BIN # [id number], PCN# [id number], [id number], ID#: [id number]  . Norethindrone-Ethinyl Estradiol-Fe Biphas (LO LOESTRIN FE) 1 MG-10 MCG / 10 MCG tablet    Sig: Take 1 tablet daily by mouth. Take 1 tablet by mouth daily.    Dispense:  3 Package    Refill:  4    BIN # [id number], PCN# [id number], [id number], ID#: [id number]: [id number]   Orders Placed This Encounter  Procedures  . RPR  . Hepatitis C antibody  . Hepatitis B surface antigen  . HIV antibody  . Ambulatory referral to ENT    Referral Priority:   Routine    Referral Type:   Consultation    Referral Reason:   Specialty Services Required    Requested Specialty:   Otolaryngology    Number of Visits Requested:   1   Follow up as needed.

## 2017-05-16 LAB — CERVICOVAGINAL ANCILLARY ONLY
BACTERIAL VAGINITIS: NEGATIVE
CANDIDA VAGINITIS: NEGATIVE
Chlamydia: NEGATIVE
NEISSERIA GONORRHEA: NEGATIVE
Trichomonas: NEGATIVE

## 2017-05-16 LAB — HEPATITIS C ANTIBODY: Hep C Virus Ab: <0.1 s/co ratio (ref 0.0–0.9)

## 2017-05-16 LAB — RPR: RPR Ser Ql: NONREACTIVE

## 2017-05-16 LAB — HEPATITIS B SURFACE ANTIGEN: Hepatitis B Surface Ag: NEGATIVE

## 2017-05-16 LAB — HIV ANTIBODY (ROUTINE TESTING W REFLEX): HIV Screen 4th Generation wRfx: NONREACTIVE

## 2017-05-21 ENCOUNTER — Other Ambulatory Visit: Payer: Self-pay | Admitting: Certified Nurse Midwife

## 2017-05-22 ENCOUNTER — Other Ambulatory Visit: Payer: Self-pay | Admitting: Certified Nurse Midwife

## 2017-05-22 LAB — CYTOLOGY - PAP
Diagnosis: NEGATIVE
HPV: NOT DETECTED

## 2017-07-09 ENCOUNTER — Ambulatory Visit: Payer: 59 | Admitting: Allergy

## 2017-07-26 ENCOUNTER — Ambulatory Visit: Payer: 59 | Admitting: Allergy

## 2017-07-26 ENCOUNTER — Encounter: Payer: Self-pay | Admitting: Allergy

## 2017-07-26 VITALS — BP 108/64 | HR 74 | Ht 63.0 in | Wt 163.0 lb

## 2017-07-26 DIAGNOSIS — T781XXA Other adverse food reactions, not elsewhere classified, initial encounter: Secondary | ICD-10-CM

## 2017-07-26 DIAGNOSIS — J309 Allergic rhinitis, unspecified: Secondary | ICD-10-CM | POA: Diagnosis not present

## 2017-07-26 DIAGNOSIS — H1010 Acute atopic conjunctivitis, unspecified eye: Secondary | ICD-10-CM

## 2017-07-26 MED ORDER — MONTELUKAST SODIUM 10 MG PO TABS
10.0000 mg | ORAL_TABLET | Freq: Every day | ORAL | 5 refills | Status: DC
Start: 2017-07-26 — End: 2019-07-07

## 2017-07-26 MED ORDER — TRIAMCINOLONE ACETONIDE 55 MCG/ACT NA AERO: 2.0000 | INHALATION_SPRAY | Freq: Every day | NASAL | 12 refills | Status: DC

## 2017-07-26 NOTE — Patient Instructions (Addendum)
Allergic rhinoconjunctivitis    - environmental allergy skin testing today is positive to grasses, english plantain weed, trees, mold, dust mites, cat, cockroach.  Allergen avoidance measures discussed and handouts provided.     - trial Allegra 180 mg.  This may replace zyrtec if it is effective for you    - start Singulair 10mg  daily    - use Astelin, nasal antihistamine spray, 2 sprays each nostril twice a day, for nasal drainage/post-nasal drip    - use nasal steroid spray like Rhinocort or Nasacort 2 sprays each nostril daily for nasal congestion     - for itchy/watery/red eyes Pazeo 1 drop each as needed daily     - allergen immunotherapy discussed including protocol, risk and benefits.  Informational packet provided.  Let us know if you are interested in this option.    Adverse food reaction    - continue avoidance of citrus fruits   Follow-up 4-6 months or sooner if needed

## 2017-07-26 NOTE — Progress Notes (Signed)
New Patient Note  RE: Molly Dawson MRN: [id number] DOB: 10-Jun-1986 Date of Office Visit: 07/26/2017  Referring provider: Roe Coombs, CNM Primary care provider: Roe Coombs, CNM  Chief Complaint: allergies  History of present illness: Molly Dawson is a 32 y.o. female presenting today for consultation for allergies.   She states she started with seasonal allergies as a child but symptoms have become year-round in adulthood.  She was taking zyrtec but she feels that her body has gotten use it so she tried Xyzal.  She states Xyzal did not help at all thus she went back to zyrtec.  She states sometimes in spring and fall she does take benadryl most nights along with her zyrtec.  She has not tried Careers adviser yet.  She has also tried flonase but she states she had a reaction where she felt like her nose was burning and there was also a recall on the medication at the time thus she just stopped it.  She also has nasal saline that she uses occasionally.  She has systane and refresh eye drops as well.   Her allergy symptoms include itchy/red/watery eyes, nose runny/stuffy, sneezing, ears itching.    She reports a citrus (oranges, lemon, lime grapefruit) allergy as she breaks out in hives on her face and spreads down her chest.  She is ok with out other fruits. This has been ongoing since childhood.    No history of asthma.  She does report history of eczema but states she only gets dry spots very infrequently.  She has triamcinolone that she will use if needed.    Review of systems: Review of Systems  Constitutional: Negative for chills, fever and malaise/fatigue.  HENT: Positive for congestion. Negative for ear discharge, ear pain, nosebleeds, sinus pain and sore throat.   Eyes: Negative for pain, discharge and redness.  Respiratory: Negative for cough, shortness of breath and wheezing.   Cardiovascular: Negative for chest pain.  Gastrointestinal: Negative for abdominal pain,  constipation, diarrhea, heartburn, nausea and vomiting.  Musculoskeletal: Negative for joint pain.  Skin: Negative for itching and rash.  Neurological: Negative for headaches.    All other systems negative unless noted above in HPI  Past medical history: Past Medical History:  Diagnosis Date  . Eczema     Past surgical history: History reviewed. No pertinent surgical history.  Family history:  Family History  Problem Relation Age of Onset  . Allergic rhinitis Mother   . Allergic rhinitis Father   . Eczema Sister   . Eczema Brother     Social history: She lives in a home with carpeting in the bedroom with gas heating and central cooling.  There are no pets in the home.  There is no concern for water damage, mildew or roaches in the home.  She works as a Naval architect.  She has no smoking exposure or use.   Medication List: Allergies as of 07/26/2017   No Known Allergies     Medication List        Accurate as of 07/26/17  5:43 PM. Always use your most recent med list.          montelukast 10 MG tablet Commonly known as:  SINGULAIR Take 1 tablet (10 mg total) by mouth at bedtime.   Norethindrone-Ethinyl Estradiol-Fe Biphas 1 MG-10 MCG / 10 MCG tablet Commonly known as:  LO LOESTRIN FE Take 1 tablet daily by mouth.   prednisoLONE acetate 1 % ophthalmic suspension Commonly known  as:  PRED FORTE   triamcinolone 55 MCG/ACT Aero nasal inhaler Commonly known as:  NASACORT ALLERGY 24HR Place 2 sprays into the nose daily.       Known medication allergies: No Known Allergies   Physical examination: Blood pressure 108/64, pulse 74, height 5\' 3"  (1.6 m), weight 163 lb (73.9 kg), SpO2 97 %.  General: Alert, interactive, in no acute distress. HEENT: PERRLA, TMs pearly gray, turbinates moderately edematous with clear discharge, post-pharynx non erythematous. Neck: Supple without lymphadenopathy. Lungs: Clear to auscultation without wheezing, rhonchi or rales.  {no increased work of breathing. CV: Normal S1, S2 without murmurs. Abdomen: Nondistended, nontender. Skin: Warm and dry, without lesions or rashes. Extremities:  No clubbing, cyanosis or edema. Neuro:   Grossly intact.  Diagnositics/Labs:  Allergy testing: environmental allergy skin prick testing is positive to grasses, English planting, trees, dust mites and botyrtis cinera.   Intradermal testing is positive to cat and cockroach. Allergy testing results were read and interpreted by provider, documented by clinical staff.   Assessment and plan:   Allergic rhinoconjunctivitis    - environmental allergy skin testing today is positive to grasses, english plantain weed, trees, mold, dust mites, cat, cockroach.  Allergen avoidance measures discussed and handouts provided.     - trial Allegra 180 mg.  This may replace zyrtec if it is effective for you    - start Singulair 10mg  daily    - use Astelin, nasal antihistamine spray, 2 sprays each nostril twice a day, for nasal drainage/post-nasal drip    - use nasal steroid spray like Rhinocort or Nasacort 2 sprays each nostril daily for nasal congestion     - for itchy/watery/red eyes Pazeo 1 drop each as needed daily     - allergen immunotherapy discussed including protocol, risk and benefits.  Informational packet provided.  Let us know if you are interested in this option.    Adverse food reaction    - continue avoidance of citrus fruits   Follow-up 4-6 months or sooner if needed  I appreciate the opportunity to take part in Vanassa's care. Please do not hesitate to contact me with questions.  Sincerely,   Margo AyeShaylar Padgett, MD Allergy/Immunology [facility]

## 2017-08-04 ENCOUNTER — Encounter (HOSPITAL_COMMUNITY): Payer: Self-pay | Admitting: Emergency Medicine

## 2017-08-04 ENCOUNTER — Emergency Department (HOSPITAL_COMMUNITY)
Admission: EM | Admit: 2017-08-04 | Discharge: 2017-08-04 | Disposition: A | Discharge status: Home / Self Care | Payer: 59 | Attending: Emergency Medicine | Admitting: Emergency Medicine

## 2017-08-04 DIAGNOSIS — Z79899 Other long term (current) drug therapy: Secondary | ICD-10-CM | POA: Diagnosis not present

## 2017-08-04 DIAGNOSIS — J329 Chronic sinusitis, unspecified: Secondary | ICD-10-CM | POA: Diagnosis not present

## 2017-08-04 DIAGNOSIS — R05 Cough: Secondary | ICD-10-CM | POA: Diagnosis present

## 2017-08-04 DIAGNOSIS — B9689 Other specified bacterial agents as the cause of diseases classified elsewhere: Secondary | ICD-10-CM | POA: Insufficient documentation

## 2017-08-04 DIAGNOSIS — J111 Influenza due to unidentified influenza virus with other respiratory manifestations: Secondary | ICD-10-CM | POA: Insufficient documentation

## 2017-08-04 DIAGNOSIS — R69 Illness, unspecified: Secondary | ICD-10-CM

## 2017-08-04 LAB — RAPID STREP SCREEN (MED CTR MEBANE ONLY): STREPTOCOCCUS, GROUP A SCREEN (DIRECT): NEGATIVE

## 2017-08-04 MED ORDER — ONDANSETRON 4 MG PO TBDP: 4.0000 mg | ORAL_TABLET | Freq: Three times a day (TID) | ORAL | 0 refills | Status: DC | PRN

## 2017-08-04 MED ORDER — ACETAMINOPHEN 500 MG PO TABS
1000.0000 mg | ORAL_TABLET | Freq: Once | ORAL | Status: AC
  Administered 2017-08-04: 1000 mg via ORAL
  Filled 2017-08-04: qty 2

## 2017-08-04 MED ORDER — ONDANSETRON 4 MG PO TBDP
4.0000 mg | ORAL_TABLET | Freq: Once | ORAL | Status: AC
  Administered 2017-08-04: 4 mg via ORAL
  Filled 2017-08-04: qty 1

## 2017-08-04 MED ORDER — AMOXICILLIN-POT CLAVULANATE 875-125 MG PO TABS: 1.0000 | ORAL_TABLET | Freq: Two times a day (BID) | ORAL | 0 refills | Status: AC

## 2017-08-04 MED ORDER — CYCLOBENZAPRINE HCL 10 MG PO TABS: 10.0000 mg | ORAL_TABLET | Freq: Two times a day (BID) | ORAL | 0 refills | Status: DC | PRN

## 2017-08-04 MED ORDER — KETOROLAC TROMETHAMINE 60 MG/2ML IM SOLN
60.0000 mg | Freq: Once | INTRAMUSCULAR | Status: AC
Start: 2017-08-04 — End: 2017-08-04
  Administered 2017-08-04: 60 mg via INTRAMUSCULAR
  Filled 2017-08-04: qty 2

## 2017-08-04 NOTE — ED Provider Notes (Signed)
[facility]-EMERGENCY DEPT Provider Note   CSN: [id number] Arrival date & time: 08/04/17  0701     History   Chief Complaint Chief Complaint  Patient presents with  . Generalized Body Aches  . Chills  . Sore Throat  . Nausea  . Emesis    HPI Molly Dawson is a 32 y.o. female.  HPI   32 year old female with a history of eczema presents with concern for cough and congestion for 2 weeks, with 2 days of body aches, fever, chills, nausea.  Reports that it started out as mild cough and congestion.  Reports now she has increasing sinus pressure and pain.  Reports she still having clear discharge from her sinuses.  For the last 2 days, she is felt worse with diffuse body aches, chills.  Other people at her work have been sick.  She did not get a flu shot this year.  Denies diarrhea, abdominal pain.  Reports the cough has improved somewhat, however still present.  No significant shortness of breath. No urinary symptoms. Severe fatigue. Not eating or drinking well.   Past Medical History:  Diagnosis Date  . Eczema     Patient Active Problem List   Diagnosis Date Noted  . Vulvar inflammation 01/04/2017    History reviewed. No pertinent surgical history.  OB History    Gravida Para Term Preterm AB Living   1 1 1     1    SAB TAB Ectopic Multiple Live Births                   Home Medications    Prior to Admission medications   Medication Sig Start Date End Date Taking? Authorizing Provider  amoxicillin-clavulanate (AUGMENTIN) 875-125 MG tablet Take 1 tablet by mouth every 12 (twelve) hours for 10 days. 08/04/17 08/14/17  Alvira Monday, MD  cyclobenzaprine (FLEXERIL) 10 MG tablet Take 1 tablet (10 mg total) by mouth 2 (two) times daily as needed for muscle spasms. 08/04/17   Alvira Monday, MD  montelukast (SINGULAIR) 10 MG tablet Take 1 tablet (10 mg total) by mouth at bedtime. 07/26/17   Marcelyn Bruins, MD  Norethindrone-Ethinyl Estradiol-Fe  Biphas (LO LOESTRIN FE) 1 MG-10 MCG / 10 MCG tablet Take 1 tablet daily by mouth. 05/07/17   Orvilla Cornwall A, CNM  ondansetron (ZOFRAN ODT) 4 MG disintegrating tablet Take 1 tablet (4 mg total) by mouth every 8 (eight) hours as needed for nausea or vomiting. 08/04/17   Alvira Monday, MD  prednisoLONE acetate (PRED FORTE) 1 % ophthalmic suspension  11/11/16   [provider]  triamcinolone (NASACORT ALLERGY 24HR) 55 MCG/ACT AERO nasal inhaler Place 2 sprays into the nose daily. 07/26/17   Marcelyn Bruins, MD    Family History Family History  Problem Relation Age of Onset  . Allergic rhinitis Mother   . Allergic rhinitis Father   . Eczema Sister   . Eczema Brother     Social History Social History   Tobacco Use  . Smoking status: Never Smoker  . Smokeless tobacco: Never Used  Substance Use Topics  . Alcohol use: Yes    Alcohol/week: 0.6 oz    Types: 1 Glasses of wine per week  . Drug use: No     Allergies   Patient has no known allergies.   Review of Systems Review of Systems  Constitutional: Positive for appetite change, chills, fatigue and fever.  HENT: Positive for congestion, rhinorrhea and sore throat. Negative for ear  pain.   Eyes: Negative for visual disturbance.  Respiratory: Positive for cough. Negative for shortness of breath.   Gastrointestinal: Positive for nausea and vomiting. Negative for abdominal pain, constipation and diarrhea.  Genitourinary: Negative for dysuria.  Musculoskeletal: Positive for arthralgias and myalgias. Negative for back pain and neck pain.  Skin: Negative for rash.  Neurological: Negative for syncope and headaches.     Physical Exam Updated Vital Signs BP 107/73 (BP Location: Right Arm)   Pulse (!) 105   Temp (!) 102.8 F (39.3 C) (Oral)   Resp 16   Ht 5\' 3"  (1.6 m)   Wt 74.8 kg (165 lb)   SpO2 99%   BMI 29.23 kg/m   Physical Exam  Constitutional: She is oriented to person, place, and time. She appears  well-developed and well-nourished. No distress.  HENT:  Head: Normocephalic and atraumatic.  Right Ear: Tympanic membrane normal.  Left Ear: Tympanic membrane normal.  Mouth/Throat: Oropharynx is clear and moist.  Facial tenderness  Eyes: Conjunctivae and EOM are normal. Pupils are equal, round, and reactive to light.  Neck: Normal range of motion.  Cardiovascular: Normal rate, regular rhythm, normal heart sounds and intact distal pulses. Exam reveals no gallop and no friction rub.  No murmur heard. Pulmonary/Chest: Effort normal and breath sounds normal. No respiratory distress. She has no wheezes. She has no rales.  Abdominal: Soft. She exhibits no distension. There is no tenderness. There is no guarding.  Musculoskeletal: She exhibits no edema or tenderness.  Neurological: She is alert and oriented to person, place, and time.  Skin: Skin is warm and dry. No rash noted. She is not diaphoretic. No erythema.  Nursing note and vitals reviewed.    ED Treatments / Results  Labs (all labs ordered are listed, but only abnormal results are displayed) Labs Reviewed  RAPID STREP SCREEN (NOT AT [facility])  CULTURE, GROUP A STREP [facility])    EKG  EKG Interpretation None       Radiology No results found.  Procedures Procedures (including critical care time)  Medications Ordered in ED Medications  acetaminophen (TYLENOL) tablet 1,000 mg (not administered)  ketorolac (TORADOL) injection 60 mg (not administered)  ondansetron (ZOFRAN-ODT) disintegrating tablet 4 mg (not administered)     Initial Impression / Assessment and Plan / ED Course  I have reviewed the triage vital signs and the nursing notes.  Pertinent labs & imaging results that were available during my care of the patient were reviewed by me and considered in my medical decision making (see chart for details).     32 year old female with a history of eczema presents with concern for cough and congestion for 2 weeks, with  2 days of body aches, fever, chills, nausea.  Patient with sick contacts and 2 days of influenza like symptoms--discussed tamiflu with patient and given she does not have high risk factors, agree to focus on supportive care.  Given preceding cough and congestion prior to symptoms, also discussed symptoms may be pneumonia or sinusitis.  She has clear breath sounds bilaterally, no hypoxia or tachypnea, and cough improving some and overall have low suspicion for pneumonia--and given we will be placing her on augmentin for sinusitis, feel we can avoid radiation of CXR at this time.  Given 2 weeks of congestion, with increasing sinus pressure and facial tenderness, will treat for bacterial sinusitis with Augmentin for 10 days.  Gave patient prescription for Zofran and Flexeril for continued supportive care of influenza-like symptoms.  Also discussed importance  of hydration, taking ibuprofen and Tylenol as needed for fevers and body aches.  Gave Tylenol, zofran and Toradol to patient in the emergency department.  Patient discharged in stable condition with understanding of reasons to return.   Final Clinical Impressions(s) / ED Diagnoses   Final diagnoses:  Bacterial sinusitis  Influenza-like illness    ED Discharge Orders        Ordered    amoxicillin-clavulanate (AUGMENTIN) 875-125 MG tablet  Every 12 hours     08/04/17 0921    ondansetron (ZOFRAN ODT) 4 MG disintegrating tablet  Every 8 hours PRN     08/04/17 0921    cyclobenzaprine (FLEXERIL) 10 MG tablet  2 times daily PRN     08/04/17 1610       Alvira Monday, MD 08/04/17 [phone]

## 2017-08-04 NOTE — ED Triage Notes (Addendum)
Patient here from home with complaitns of generalized body aches, chills, sore throat x2 weeks. Reports " I hurt all over". Denies chest pain. Also reports nausea, vomiting yesterday.

## 2017-08-04 NOTE — Discharge Instructions (Signed)
Take Tylenol 1000 mg 4 times a day for 1 week as needed for fever.  This is the maximum dose of Tylenol (acetaminophen) you can take from all sources. Please check other over-the-counter medications and prescriptions to ensure you are not taking other medications that contain acetaminophen.  You may also take ibuprofen 400 mg 6 times a day alternating with or at the same time as tylenol.

## 2017-08-06 LAB — CULTURE, GROUP A STREP (THRC)

## 2017-10-23 ENCOUNTER — Telehealth: Payer: Self-pay | Admitting: *Deleted

## 2017-10-23 NOTE — Telephone Encounter (Signed)
Pt called to office stating she has taken home pregnancy test and would like to discuss. Attempt to contact pt.  LM on VM to call office.

## 2017-10-24 ENCOUNTER — Other Ambulatory Visit: Payer: Self-pay | Admitting: Certified Nurse Midwife

## 2017-10-24 ENCOUNTER — Telehealth: Payer: Self-pay | Admitting: *Deleted

## 2017-10-24 NOTE — Telephone Encounter (Signed)
When was the last LMP?  She does have a hx of anemia: CBC would be ok to do along with a TSH.    Thank you Molly Dawson

## 2017-10-24 NOTE — Telephone Encounter (Signed)
[facility].  Pt states that she is on LoLoestrin and hasn't had cycles with this BC. Pt in now complaining of Nausea, appetite changes and ?decrease iron level. Pt states she took at home UPT and was negative. Pt is concerned about current symptoms and if BC related.  Pt made aware in order to determine decrease in iron she will need appt for labs.   Pt was offered options for appt or to message provider for recommendations. Pt would like recommendations for now and if appt needed we will contact pt.      Please advise.

## 2017-11-09 DIAGNOSIS — H04123 Dry eye syndrome of bilateral lacrimal glands: Secondary | ICD-10-CM | POA: Diagnosis not present

## 2017-11-09 DIAGNOSIS — H40033 Anatomical narrow angle, bilateral: Secondary | ICD-10-CM | POA: Diagnosis not present

## 2018-01-24 ENCOUNTER — Ambulatory Visit: Payer: 59 | Admitting: Allergy

## 2018-05-16 ENCOUNTER — Ambulatory Visit (INDEPENDENT_AMBULATORY_CARE_PROVIDER_SITE_OTHER): Payer: 59 | Admitting: Certified Nurse Midwife

## 2018-05-16 ENCOUNTER — Encounter: Payer: Self-pay | Admitting: Certified Nurse Midwife

## 2018-05-16 VITALS — Ht 63.0 in | Wt 167.0 lb

## 2018-05-16 DIAGNOSIS — Z01419 Encounter for gynecological examination (general) (routine) without abnormal findings: Secondary | ICD-10-CM | POA: Diagnosis not present

## 2018-05-16 DIAGNOSIS — B373 Candidiasis of vulva and vagina: Secondary | ICD-10-CM

## 2018-05-16 DIAGNOSIS — B9689 Other specified bacterial agents as the cause of diseases classified elsewhere: Secondary | ICD-10-CM

## 2018-05-16 DIAGNOSIS — N760 Acute vaginitis: Secondary | ICD-10-CM

## 2018-05-16 DIAGNOSIS — Z1151 Encounter for screening for human papillomavirus (HPV): Secondary | ICD-10-CM | POA: Diagnosis not present

## 2018-05-16 DIAGNOSIS — N898 Other specified noninflammatory disorders of vagina: Secondary | ICD-10-CM | POA: Diagnosis not present

## 2018-05-16 DIAGNOSIS — Z3041 Encounter for surveillance of contraceptive pills: Secondary | ICD-10-CM

## 2018-05-16 DIAGNOSIS — Z113 Encounter for screening for infections with a predominantly sexual mode of transmission: Secondary | ICD-10-CM

## 2018-05-16 DIAGNOSIS — Z124 Encounter for screening for malignant neoplasm of cervix: Secondary | ICD-10-CM

## 2018-05-16 DIAGNOSIS — B3731 Acute candidiasis of vulva and vagina: Secondary | ICD-10-CM

## 2018-05-16 MED ORDER — NORETHIN-ETH ESTRAD-FE BIPHAS 1 MG-10 MCG / 10 MCG PO TABS: 1.0000 | ORAL_TABLET | Freq: Every day | ORAL | 3 refills | Status: DC

## 2018-05-16 NOTE — Progress Notes (Signed)
GYNECOLOGY ANNUAL PREVENTATIVE CARE ENCOUNTER NOTE  Subjective:   Molly Dawson is a 32 y.o. G38P1021 female here for a routine annual gynecologic exam.  Current complaints: none.   Denies abnormal vaginal bleeding, discharge, pelvic pain, problems with intercourse or other gynecologic concerns. Desires STD screening.    Gynecologic History No LMP recorded. (Menstrual status: Oral contraceptives). Contraception: OCP (estrogen/progesterone) Last Pap: 05/2017. Results were: normal with negative HPV   Obstetric History OB History  Gravida Para Term Preterm AB Living  3 1 1   2 1   SAB TAB Ectopic Multiple Live Births  1 1     1     # Outcome Date GA Lbr Len/2nd Weight Sex Delivery Anes PTL Lv  3 Term 2010     Vag-Spont     2 TAB           1 SAB             Past Medical History:  Diagnosis Date  . Eczema     History reviewed. No pertinent surgical history.  Current Outpatient Medications on File Prior to Visit  Medication Sig Dispense Refill  . cyclobenzaprine (FLEXERIL) 10 MG tablet Take 1 tablet (10 mg total) by mouth 2 (two) times daily as needed for muscle spasms. 20 tablet 0  . montelukast (SINGULAIR) 10 MG tablet Take 1 tablet (10 mg total) by mouth at bedtime. 30 tablet 5  . ondansetron (ZOFRAN ODT) 4 MG disintegrating tablet Take 1 tablet (4 mg total) by mouth every 8 (eight) hours as needed for nausea or vomiting. 20 tablet 0  . prednisoLONE acetate (PRED FORTE) 1 % ophthalmic suspension     . triamcinolone (NASACORT ALLERGY 24HR) 55 MCG/ACT AERO nasal inhaler Place 2 sprays into the nose daily. 1 Inhaler 12   No current facility-administered medications on file prior to visit.     No Known Allergies  Social History:  reports that she has never smoked. She has never used smokeless tobacco. She reports that she drinks about 1.0 standard drinks of alcohol per week. She reports that she does not use drugs.  Family History  Problem Relation Age of Onset  .  Allergic rhinitis Mother   . Allergic rhinitis Father   . Eczema Sister   . Eczema Brother     The following portions of the patient's history were reviewed and updated as appropriate: allergies, current medications, past family history, past medical history, past social history, past surgical history and problem list.  Review of Systems Pertinent items noted in HPI and remainder of comprehensive ROS otherwise negative.   Objective:  Ht 5\' 3"  (1.6 m)   Wt 167 lb (75.8 kg)   BMI 29.58 kg/m  CONSTITUTIONAL: Well-developed, well-nourished female in no acute distress.  HENT:  Normocephalic, atraumatic, External right and left ear normal. Oropharynx is clear and moist EYES: Conjunctivae and EOM are normal. Pupils are equal, round, and reactive to light.  NECK: Normal range of motion, supple, no masses.  Normal thyroid.  SKIN: Skin is warm and dry. No rash noted. Not diaphoretic. No erythema. No pallor. MUSCULOSKELETAL: Normal range of motion. No tenderness.  No cyanosis, clubbing, or edema.  2+ distal pulses. NEUROLOGIC: Alert and oriented to person, place, and time. Normal reflexes, muscle tone coordination. No cranial nerve deficit noted. PSYCHIATRIC: Normal mood and affect. Normal behavior. Normal judgment and thought content. CARDIOVASCULAR: Normal heart rate noted, regular rhythm RESPIRATORY: Clear to auscultation bilaterally. Effort and breath sounds normal, no problems  with respiration noted. BREASTS: Symmetric in size. No masses, skin changes, nipple drainage, or lymphadenopathy. ABDOMEN: Soft, normal bowel sounds, no distention noted.  No tenderness, rebound or guarding.  PELVIC: Normal appearing external genitalia; normal appearing vaginal mucosa and cervix.  No abnormal discharge noted.  Pap smear obtained.  Normal uterine size, no other palpable masses, no uterine or adnexal tenderness.  Assessment and Plan:  1. Encounter for annual routine gynecological examination - Normal  well woman examination  - Patient declined flu vaccine  - Cytology - PAP  2. Screening for STDs (sexually transmitted diseases) - Cytology - PAP - RPR - HIV antibody (with reflex) - Hepatitis B Surface AntiGEN  3. Encounter for surveillance of contraceptive pills - No complaints on birth control, wants refill  - Norethindrone-Ethinyl Estradiol-Fe Biphas (LO LOESTRIN FE) 1 MG-10 MCG / 10 MCG tablet; Take 1 tablet by mouth daily.  Dispense: 3 Package; Refill: 3  Will follow up results of pap smear and manage accordingly. Routine preventative health maintenance measures emphasized.   Sharyon CableVeronica C Crystall Donaldson, CNM [facility], [facility]

## 2018-05-16 NOTE — Progress Notes (Signed)
Pt presents for annual. Pt has no concerns. Pt desires std screening.

## 2018-05-17 LAB — HIV ANTIBODY (ROUTINE TESTING W REFLEX): HIV Screen 4th Generation wRfx: NONREACTIVE

## 2018-05-17 LAB — HEPATITIS B SURFACE ANTIGEN: Hepatitis B Surface Ag: NEGATIVE

## 2018-05-17 LAB — RPR: RPR Ser Ql: NONREACTIVE

## 2018-05-20 LAB — CYTOLOGY - PAP
Bacterial vaginitis: POSITIVE — AB
Candida vaginitis: POSITIVE — AB
Chlamydia: NEGATIVE
Diagnosis: NEGATIVE
HPV: NOT DETECTED
Neisseria Gonorrhea: NEGATIVE
Trichomonas: NEGATIVE

## 2018-05-20 MED ORDER — TINIDAZOLE 500 MG PO TABS
2.0000 g | ORAL_TABLET | Freq: Every day | ORAL | 0 refills | Status: DC
Start: 2018-05-20 — End: 2018-07-17

## 2018-05-20 MED ORDER — FLUCONAZOLE 150 MG PO TABS: 150.0000 mg | ORAL_TABLET | Freq: Every day | ORAL | 0 refills | Status: DC

## 2018-05-20 NOTE — Addendum Note (Signed)
Addended by: Sharyon CableOGERS, Ulonda Klosowski C on: 05/20/2018 03:57 PM   Modules accepted: Orders

## 2018-07-17 ENCOUNTER — Ambulatory Visit
Admission: EM | Admit: 2018-07-17 | Discharge: 2018-07-17 | Disposition: A | Discharge status: Home / Self Care | Payer: 59 | Attending: Physician Assistant | Admitting: Physician Assistant

## 2018-07-17 ENCOUNTER — Encounter: Payer: Self-pay | Admitting: Emergency Medicine

## 2018-07-17 DIAGNOSIS — J029 Acute pharyngitis, unspecified: Secondary | ICD-10-CM

## 2018-07-17 DIAGNOSIS — J069 Acute upper respiratory infection, unspecified: Secondary | ICD-10-CM

## 2018-07-17 NOTE — ED Notes (Signed)
Patient able to ambulate independently  

## 2018-07-17 NOTE — Discharge Instructions (Addendum)
Return if any problems.  Try taking a decongestant to help with symptoms

## 2018-07-17 NOTE — ED Notes (Signed)
Sore throat POCT came back NEGATIVE.  Culture ordered.

## 2018-07-17 NOTE — ED Triage Notes (Signed)
Pt presents to [facility] for 2 days of sore throat, plus, chills, night sweats, cough, headache, and sinus pressure.

## 2018-07-17 NOTE — ED Provider Notes (Signed)
[facility]    CSN: [id number] Arrival date & time: 07/17/18  1327     History   Chief Complaint Chief Complaint  Patient presents with  . Sore Throat    HPI Molly Dawson is a 33 y.o. female.   The history is provided by the patient. No language interpreter was used.  Sore Throat  This is a new problem. The problem occurs constantly. The problem has been gradually worsening. Pertinent negatives include no chest pain and no abdominal pain. Nothing aggravates the symptoms. Nothing relieves the symptoms. She has tried nothing for the symptoms. The treatment provided no relief.    Past Medical History:  Diagnosis Date  . Eczema     Patient Active Problem List   Diagnosis Date Noted  . Vulvar inflammation 01/04/2017    History reviewed. No pertinent surgical history.  OB History    Gravida  3   Para  1   Term  1   Preterm      AB  2   Living  1     SAB  1   TAB  1   Ectopic      Multiple      Live Births  1            Home Medications    Prior to Admission medications   Medication Sig Start Date End Date Taking? Authorizing Provider  montelukast (SINGULAIR) 10 MG tablet Take 1 tablet (10 mg total) by mouth at bedtime. 07/26/17   Marcelyn Bruins, MD  Norethindrone-Ethinyl Estradiol-Fe Biphas (LO LOESTRIN FE) 1 MG-10 MCG / 10 MCG tablet Take 1 tablet by mouth daily. 05/16/18   Sharyon Cable, CNM    Family History Family History  Problem Relation Age of Onset  . Allergic rhinitis Mother   . Allergic rhinitis Father   . Eczema Sister   . Eczema Brother     Social History Social History   Tobacco Use  . Smoking status: Never Smoker  . Smokeless tobacco: Never Used  Substance Use Topics  . Alcohol use: Yes    Alcohol/week: 1.0 standard drinks    Types: 1 Glasses of wine per week    Comment: occ  . Drug use: No     Allergies   Patient has no known allergies.   Review of Systems Review of Systems    HENT: Positive for sore throat.   Cardiovascular: Negative for chest pain.  Gastrointestinal: Negative for abdominal pain.  All other systems reviewed and are negative.    Physical Exam Triage Vital Signs ED Triage Vitals  Enc Vitals Group     BP 07/17/18 1344 99/70     Pulse Rate 07/17/18 1344 94     Resp 07/17/18 1344 18     Temp 07/17/18 1344 98 F (36.7 C)     Temp Source 07/17/18 1344 Oral     SpO2 07/17/18 1344 98 %     Weight --      Height --      Head Circumference --      Peak Flow --      Pain Score 07/17/18 1345 5     Pain Loc --      Pain Edu? --      Excl. in GC? --    No data found.  Updated Vital Signs BP 99/70 (BP Location: Left Arm)   Pulse 94   Temp 98 F (36.7 C) (Oral)   Resp 18  SpO2 98%   Visual Acuity Right Eye Distance:   Left Eye Distance:   Bilateral Distance:    Right Eye Near:   Left Eye Near:    Bilateral Near:     Physical Exam Vitals signs and nursing note reviewed.  Constitutional:      Appearance: She is normal weight.  HENT:     Head: Normocephalic.     Right Ear: Tympanic membrane normal.     Left Ear: Tympanic membrane normal.     Mouth/Throat:     Pharynx: Pharyngeal swelling present.  Eyes:     Conjunctiva/sclera: Conjunctivae normal.  Neck:     Musculoskeletal: Normal range of motion.  Cardiovascular:     Rate and Rhythm: Normal rate.  Abdominal:     Palpations: Abdomen is soft.  Skin:    General: Skin is warm.  Neurological:     Mental Status: She is alert.      UC Treatments / Results  Labs (all labs ordered are listed, but only abnormal results are displayed) Labs Reviewed  CULTURE, GROUP A STREP [facility])  POCT RAPID STREP A (OFFICE)    EKG None  Radiology No results found.  Procedures Procedures (including critical care time)  Medications Ordered in UC Medications - No data to display  Initial Impression / Assessment and Plan / UC Course  I have reviewed the triage vital signs and  the nursing notes.  Pertinent labs & imaging results that were available during my care of the patient were reviewed by me and considered in my medical decision making (see chart for details).     MDM  Strep negative.  Pt advised decongestant and ibuprofen  Final Clinical Impressions(s) / UC Diagnoses   Final diagnoses:  Viral pharyngitis  Acute upper respiratory infection     Discharge Instructions     Return if any problems.  Try taking a decongestant to help with symptoms    ED Prescriptions    None     Controlled Substance Prescriptions [place] Controlled Substance Registry consulted? Not Applicable   Elson Areas, [place] 07/17/18 1600

## 2018-07-20 LAB — CULTURE, GROUP A STREP (THRC)

## 2019-05-21 ENCOUNTER — Ambulatory Visit (INDEPENDENT_AMBULATORY_CARE_PROVIDER_SITE_OTHER): Payer: 59

## 2019-05-21 ENCOUNTER — Other Ambulatory Visit: Payer: Self-pay

## 2019-05-21 VITALS — BP 130/77 | HR 106 | Wt 172.2 lb

## 2019-05-21 DIAGNOSIS — Z3201 Encounter for pregnancy test, result positive: Secondary | ICD-10-CM | POA: Diagnosis not present

## 2019-05-21 DIAGNOSIS — Z3200 Encounter for pregnancy test, result unknown: Secondary | ICD-10-CM

## 2019-05-21 LAB — POCT URINE PREGNANCY: Preg Test, Ur: POSITIVE — AB

## 2019-05-21 MED ORDER — PREPLUS 27-1 MG PO TABS: 1.0000 | ORAL_TABLET | Freq: Every day | ORAL | 13 refills | Status: DC

## 2019-05-21 NOTE — Progress Notes (Signed)
Pt is here for UPT. UPT is positive. LMP 04/19/19. EDD 01/24/20. Pt advised to make New OB appt and begin taking prenatal vitamins. Rx for vitamins sent to pharmacy, pt verbalizes understanding.

## 2019-06-09 ENCOUNTER — Ambulatory Visit: Payer: 59

## 2019-06-09 DIAGNOSIS — Z3480 Encounter for supervision of other normal pregnancy, unspecified trimester: Secondary | ICD-10-CM | POA: Insufficient documentation

## 2019-06-09 MED ORDER — BLOOD PRESSURE KIT DEVI: 1.0000 | 0 refills | Status: DC | PRN

## 2019-06-09 MED ORDER — PRENATE DHA 28-0.6-0.4-300 MG PO CAPS: 1.0000 | ORAL_CAPSULE | Freq: Every day | ORAL | 12 refills | Status: DC

## 2019-06-09 NOTE — Progress Notes (Signed)
..    Virtual Visit via Telephone Note  I connected with Molly Dawson on 06/09/19 at  3:15 PM EST by telephone and verified that I am speaking with the correct person using two identifiers.  Location: [facility]   I discussed the limitations, risks, security and privacy concerns of performing an evaluation and management service by telephone and the availability of in person appointments. I also discussed with the patient that there may be a patient responsible charge related to this service. The patient expressed understanding and agreed to proceed.   History of Present Illness: PRENATAL INTAKE SUMMARY  Molly Dawson presents today New OB Nurse Interview.  OB History    Gravida  5   Para  1   Term  1   Preterm      AB  3   Living  1     SAB  1   TAB  2   Ectopic      Multiple      Live Births  1          I have reviewed the patient's medical, obstetrical, social, and family histories, medications, and available lab results.  SUBJECTIVE She has no unusual complaints   Observations/Objective: Initial nurse interview for history/labs (New OB)  EDD: 01-24-20 GA: [redacted]w[redacted]d GP: G5P1  GENERAL APPEARANCE: alert, well appearing  Assessment and Plan: Normal pregnancy BP cuff sent to [facility]   Follow Up Instructions:   I discussed the assessment and treatment plan with the patient. The patient was provided an opportunity to ask questions and all were answered. The patient agreed with the plan and demonstrated an understanding of the instructions.   The patient was advised to call back or seek an in-person evaluation if the symptoms worsen or if the condition fails to improve as anticipated.  I provided 15 minutes of non-face-to-face time during this encounter.   Hinton Lovely, RN

## 2019-06-11 NOTE — Progress Notes (Signed)
I have reviewed this chart and agree with the RN/CMA assessment and management.    K. Meryl Taygen Acklin, M.D. Attending [facility] (Faculty Practice)   

## 2019-07-07 ENCOUNTER — Other Ambulatory Visit: Payer: Self-pay

## 2019-07-07 ENCOUNTER — Ambulatory Visit (HOSPITAL_COMMUNITY)
Admission: RE | Admit: 2019-07-07 | Discharge: 2019-07-07 | Disposition: A | Discharge status: Home / Self Care | Payer: 59 | Source: Ambulatory Visit | Attending: Advanced Practice Midwife | Admitting: Advanced Practice Midwife

## 2019-07-07 ENCOUNTER — Ambulatory Visit (INDEPENDENT_AMBULATORY_CARE_PROVIDER_SITE_OTHER): Payer: 59 | Admitting: Advanced Practice Midwife

## 2019-07-07 ENCOUNTER — Encounter: Payer: Self-pay | Admitting: Advanced Practice Midwife

## 2019-07-07 ENCOUNTER — Telehealth: Payer: Self-pay | Admitting: Advanced Practice Midwife

## 2019-07-07 VITALS — BP 106/69 | HR 85 | Wt 181.0 lb

## 2019-07-07 DIAGNOSIS — O368390 Maternal care for abnormalities of the fetal heart rate or rhythm, unspecified trimester, not applicable or unspecified: Secondary | ICD-10-CM | POA: Diagnosis present

## 2019-07-07 DIAGNOSIS — Z362 Encounter for other antenatal screening follow-up: Secondary | ICD-10-CM

## 2019-07-07 DIAGNOSIS — Z3480 Encounter for supervision of other normal pregnancy, unspecified trimester: Secondary | ICD-10-CM

## 2019-07-07 NOTE — Progress Notes (Signed)
Pt presented for new OB visit but FHT were not heard with doppler. OB US performed abdominally by Angelica Pou, CMA, and confirmed by myself with slightly enlarged uterus, gestational sac and ? Yolk sac visualized only, no fetal pole clearly seen transabdominally.  Stat US for viability ordered, pt to MFM at [facility] for results today. Discussed possible findings with pt including pregnancy of earlier gestation than LMP dates or miscarriage.  Questions answered.

## 2019-07-07 NOTE — Patient Instructions (Signed)
First Trimester of Pregnancy The first trimester of pregnancy is from week 1 until the end of week 13 (months 1 through 3). A week after a sperm fertilizes an egg, the egg will implant on the wall of the uterus. This embryo will begin to develop into a baby. Genes from you and your partner will form the baby. The female genes will determine whether the baby will be a boy or a girl. At 6-8 weeks, the eyes and face will be formed, and the heartbeat can be seen on ultrasound. At the end of 12 weeks, all the baby's organs will be formed. Now that you are pregnant, you will want to do everything you can to have a healthy baby. Two of the most important things are to get good prenatal care and to follow your health care provider's instructions. Prenatal care is all the medical care you receive before the baby's birth. This care will help prevent, find, and treat any problems during the pregnancy and childbirth. Body changes during your first trimester Your body goes through many changes during pregnancy. The changes vary from woman to woman.  You may gain or lose a couple of pounds at first.  You may feel sick to your stomach (nauseous) and you may throw up (vomit). If the vomiting is uncontrollable, call your health care provider.  You may tire easily.  You may develop headaches that can be relieved by medicines. All medicines should be approved by your health care provider.  You may urinate more often. Painful urination may mean you have a bladder infection.  You may develop heartburn as a result of your pregnancy.  You may develop constipation because certain hormones are causing the muscles that push stool through your intestines to slow down.  You may develop hemorrhoids or swollen veins (varicose veins).  Your breasts may begin to grow larger and become tender. Your nipples may stick out more, and the tissue that surrounds them (areola) may become darker.  Your gums may bleed and may be  sensitive to brushing and flossing.  Dark spots or blotches (chloasma, mask of pregnancy) may develop on your face. This will likely fade after the baby is born.  Your menstrual periods will stop.  You may have a loss of appetite.  You may develop cravings for certain kinds of food.  You may have changes in your emotions from day to day, such as being excited to be pregnant or being concerned that something may go wrong with the pregnancy and baby.  You may have more vivid and strange dreams.  You may have changes in your hair. These can include thickening of your hair, rapid growth, and changes in texture. Some women also have hair loss during or after pregnancy, or hair that feels dry or thin. Your hair will most likely return to normal after your baby is born. What to expect at prenatal visits During a routine prenatal visit:  You will be weighed to make sure you and the baby are growing normally.  Your blood pressure will be taken.  Your abdomen will be measured to track your baby's growth.  The fetal heartbeat will be listened to between weeks 10 and 14 of your pregnancy.  Test results from any previous visits will be discussed. Your health care provider may ask you:  How you are feeling.  If you are feeling the baby move.  If you have had any abnormal symptoms, such as leaking fluid, bleeding, severe headaches, or abdominal   cramping.  If you are using any tobacco products, including cigarettes, chewing tobacco, and electronic cigarettes.  If you have any questions. Other tests that may be performed during your first trimester include:  Blood tests to find your blood type and to check for the presence of any previous infections. The tests will also be used to check for low iron levels (anemia) and protein on red blood cells (Rh antibodies). Depending on your risk factors, or if you previously had diabetes during pregnancy, you may have tests to check for high blood sugar  that affects pregnant women (gestational diabetes).  Urine tests to check for infections, diabetes, or protein in the urine.  An ultrasound to confirm the proper growth and development of the baby.  Fetal screens for spinal cord problems (spina bifida) and Down syndrome.  HIV (human immunodeficiency virus) testing. Routine prenatal testing includes screening for HIV, unless you choose not to have this test.  You may need other tests to make sure you and the baby are doing well. Follow these instructions at home: Medicines  Follow your health care provider's instructions regarding medicine use. Specific medicines may be either safe or unsafe to take during pregnancy.  Take a prenatal vitamin that contains at least 600 micrograms (mcg) of folic acid.  If you develop constipation, try taking a stool softener if your health care provider approves. Eating and drinking   Eat a balanced diet that includes fresh fruits and vegetables, whole grains, good sources of protein such as meat, eggs, or tofu, and low-fat dairy. Your health care provider will help you determine the amount of weight gain that is right for you.  Avoid raw meat and uncooked cheese. These carry germs that can cause birth defects in the baby.  Eating four or five small meals rather than three large meals a day may help relieve nausea and vomiting. If you start to feel nauseous, eating a few soda crackers can be helpful. Drinking liquids between meals, instead of during meals, also seems to help ease nausea and vomiting.  Limit foods that are high in fat and processed sugars, such as fried and sweet foods.  To prevent constipation: ? Eat foods that are high in fiber, such as fresh fruits and vegetables, whole grains, and beans. ? Drink enough fluid to keep your urine clear or pale yellow. Activity  Exercise only as directed by your health care provider. Most women can continue their usual exercise routine during  pregnancy. Try to exercise for 30 minutes at least 5 days a week. Exercising will help you: ? Control your weight. ? Stay in shape. ? Be prepared for labor and delivery.  Experiencing pain or cramping in the lower abdomen or lower back is a good sign that you should stop exercising. Check with your health care provider before continuing with normal exercises.  Try to avoid standing for long periods of time. Move your legs often if you must stand in one place for a long time.  Avoid heavy lifting.  Wear low-heeled shoes and practice good posture.  You may continue to have sex unless your health care provider tells you not to. Relieving pain and discomfort  Wear a good support bra to relieve breast tenderness.  Take warm sitz baths to soothe any pain or discomfort caused by hemorrhoids. Use hemorrhoid cream if your health care provider approves.  Rest with your legs elevated if you have leg cramps or low back pain.  If you develop varicose veins in   your legs, wear support hose. Elevate your feet for 15 minutes, 3-4 times a day. Limit salt in your diet. Prenatal care  Schedule your prenatal visits by the twelfth week of pregnancy. They are usually scheduled monthly at first, then more often in the last 2 months before delivery.  Write down your questions. Take them to your prenatal visits.  Keep all your prenatal visits as told by your health care provider. This is important. Safety  Wear your seat belt at all times when driving.  Make a list of emergency phone numbers, including numbers for family, friends, the hospital, and police and fire departments. General instructions  Ask your health care provider for a referral to a local prenatal education class. Begin classes no later than the beginning of month 6 of your pregnancy.  Ask for help if you have counseling or nutritional needs during pregnancy. Your health care provider can offer advice or refer you to specialists for help  with various needs.  Do not use hot tubs, steam rooms, or saunas.  Do not douche or use tampons or scented sanitary pads.  Do not cross your legs for long periods of time.  Avoid cat litter boxes and soil used by cats. These carry germs that can cause birth defects in the baby and possibly loss of the fetus by miscarriage or stillbirth.  Avoid all smoking, herbs, alcohol, and medicines not prescribed by your health care provider. Chemicals in these products affect the formation and growth of the baby.  Do not use any products that contain nicotine or tobacco, such as cigarettes and e-cigarettes. If you need help quitting, ask your health care provider. You may receive counseling support and other resources to help you quit.  Schedule a dentist appointment. At home, brush your teeth with a soft toothbrush and be gentle when you floss. Contact a health care provider if:  You have dizziness.  You have mild pelvic cramps, pelvic pressure, or nagging pain in the abdominal area.  You have persistent nausea, vomiting, or diarrhea.  You have a bad smelling vaginal discharge.  You have pain when you urinate.  You notice increased swelling in your face, hands, legs, or ankles.  You are exposed to fifth disease or chickenpox.  You are exposed to German measles (rubella) and have never had it. Get help right away if:  You have a fever.  You are leaking fluid from your vagina.  You have spotting or bleeding from your vagina.  You have severe abdominal cramping or pain.  You have rapid weight gain or loss.  You vomit blood or material that looks like coffee grounds.  You develop a severe headache.  You have shortness of breath.  You have any kind of trauma, such as from a fall or a car accident. Summary  The first trimester of pregnancy is from week 1 until the end of week 13 (months 1 through 3).  Your body goes through many changes during pregnancy. The changes vary from  woman to woman.  You will have routine prenatal visits. During those visits, your health care provider will examine you, discuss any test results you may have, and talk with you about how you are feeling. This information is not intended to replace advice given to you by your health care provider. Make sure you discuss any questions you have with your health care provider. Document Revised: 06/01/2017 Document Reviewed: 05/31/2016 Elsevier Patient Education  2020 [employer]vier Inc.  

## 2019-07-08 ENCOUNTER — Other Ambulatory Visit (HOSPITAL_COMMUNITY)
Admission: RE | Admit: 2019-07-08 | Discharge: 2019-07-08 | Disposition: A | Discharge status: Home / Self Care | Payer: 59 | Source: Ambulatory Visit | Attending: Advanced Practice Midwife | Admitting: Advanced Practice Midwife

## 2019-07-08 ENCOUNTER — Encounter: Payer: Self-pay | Admitting: Advanced Practice Midwife

## 2019-07-08 ENCOUNTER — Ambulatory Visit (INDEPENDENT_AMBULATORY_CARE_PROVIDER_SITE_OTHER): Payer: 59 | Admitting: Advanced Practice Midwife

## 2019-07-08 VITALS — BP 101/68 | HR 97 | Wt 176.0 lb

## 2019-07-08 DIAGNOSIS — Z113 Encounter for screening for infections with a predominantly sexual mode of transmission: Secondary | ICD-10-CM | POA: Diagnosis not present

## 2019-07-08 DIAGNOSIS — O021 Missed abortion: Secondary | ICD-10-CM

## 2019-07-08 MED ORDER — TRAMADOL HCL 50 MG PO TABS: 50.0000 mg | ORAL_TABLET | Freq: Four times a day (QID) | ORAL | 0 refills | Status: DC | PRN

## 2019-07-08 MED ORDER — MISOPROSTOL 200 MCG PO TABS: ORAL_TABLET | ORAL | 0 refills | Status: DC

## 2019-07-08 NOTE — Telephone Encounter (Signed)
Pt had OB US comp less than 14 weeks with transvaginal at [facility] on 07/07/19 and results were called to ordering CNM.  I called to discuss results with the patient and schedule visit in the office.  [place] indicates large gestational sac without fetal pole, definitive for failed pregnancy.  Pt given opportunity to ask questions.  Briefly reviewed treatment options including expectant management, Cytotec, or D&C.  Appointment made for 07/08/19 for further discussion of treatment. Bleeding/miscarriage precautions/reasons to go to MAU reviewed. Pt states understanding.

## 2019-07-08 NOTE — Progress Notes (Signed)
  GYNECOLOGY PROGRESS NOTE  History:  34 y.o. [id number] presents to [facility] office today for miscarriage follow up. She presented yesterday, 07/06/18, for new OB visit and FHT were not heard by doppler, bedside ultrasound without evidence of viable pregnancy, and ultrasound at [facility] confirmed large gestational sac without fetal pole, definitive for failed pregnancy.  These results were discussed on the phone with Ms. Shroff on 07/06/18 and she presents today for further discussion and to go over options.   She reports pink spotting occasionally when wiping, no pain.  She denies h/a, dizziness, shortness of breath, n/v, or fever/chills.    The following portions of the patient's history were reviewed and updated as appropriate: allergies, current medications, past family history, past medical history, past social history, past surgical history and problem list. Last pap smear on 05/16/18 was normal.  Review of Systems:  Pertinent items are noted in HPI.   Objective:  Physical Exam Blood pressure 101/68, pulse 97, weight 176 lb (79.8 kg), last menstrual period 04/19/2019.   VS reviewed, nursing note reviewed,  Constitutional: well developed, well nourished, no distress HEENT: normocephalic CV: normal rate Pulm/chest wall: normal effort Breast Exam: deferred Abdomen: soft Neuro: alert and oriented x 3 Skin: warm, dry Psych: affect normal Pelvic exam: Cervix pink, visually closed, without lesion, scant white creamy discharge, vaginal walls and external genitalia normal Bimanual exam: Cervix 0/long/high, firm, anterior, neg CMT, uterus nontender, nonenlarged, adnexa without tenderness, enlargement, or mass  Assessment & Plan:  1. Missed abortion  --Reviewed [place] again with pt, discussed options including expectant management, Cytotec or D&C. Pt elects to try Cytotec with follow up in our office.   - CBC - B-HCG Quant - HIV antibody (with reflex) - Hepatitis C antibody - Hepatitis B surface  antigen - RPR - Cervicovaginal ancillary only( [place]) - ABO AND RH  - misoprostol (CYTOTEC) 200 MCG tablet; Place all 3 tablets in your cheek until dissolved to a paste then swallow.  Dispense: 3 tablet; Refill: 0 - traMADol (ULTRAM) 50 MG tablet; Take 1 tablet (50 mg total) by mouth every 6 (six) hours as needed.  Dispense: 10 tablet; Refill: 0   2. Screening examination for STD (sexually transmitted disease) --See above orders  Sharen Counter, CNM 3:17 PM

## 2019-07-09 LAB — RPR: RPR Ser Ql: NONREACTIVE

## 2019-07-09 LAB — CERVICOVAGINAL ANCILLARY ONLY
Chlamydia: NEGATIVE
Comment: NEGATIVE
Comment: NEGATIVE
Comment: NORMAL
Neisseria Gonorrhea: NEGATIVE
Trichomonas: NEGATIVE

## 2019-07-09 LAB — CBC
Hematocrit: 37.1 % (ref 34.0–46.6)
Hemoglobin: 12.2 g/dL (ref 11.1–15.9)
MCH: 26.5 pg — ABNORMAL LOW (ref 26.6–33.0)
MCHC: 32.9 g/dL (ref 31.5–35.7)
MCV: 81 fL (ref 79–97)
Platelets: 265 10*3/uL (ref 150–450)
RBC: 4.61 x10E6/uL (ref 3.77–5.28)
RDW: 14.3 % (ref 11.7–15.4)
WBC: 9 10*3/uL (ref 3.4–10.8)

## 2019-07-09 LAB — ABO AND RH: Rh Factor: POSITIVE

## 2019-07-09 LAB — HEPATITIS C ANTIBODY: Hep C Virus Ab: <0.1 s/co ratio (ref 0.0–0.9)

## 2019-07-09 LAB — HEPATITIS B SURFACE ANTIGEN: Hepatitis B Surface Ag: NEGATIVE

## 2019-07-09 LAB — BETA HCG QUANT (REF LAB): hCG Quant: 3579 m[IU]/mL

## 2019-07-09 LAB — HIV ANTIBODY (ROUTINE TESTING W REFLEX): HIV Screen 4th Generation wRfx: NONREACTIVE

## 2019-07-10 ENCOUNTER — Inpatient Hospital Stay (HOSPITAL_COMMUNITY): Payer: 59

## 2019-07-10 ENCOUNTER — Encounter (HOSPITAL_COMMUNITY): Payer: Self-pay | Admitting: Obstetrics & Gynecology

## 2019-07-10 ENCOUNTER — Other Ambulatory Visit: Payer: Self-pay

## 2019-07-10 ENCOUNTER — Inpatient Hospital Stay (HOSPITAL_COMMUNITY)
Admission: AD | Admit: 2019-07-10 | Discharge: 2019-07-10 | Disposition: A | Discharge status: Home / Self Care | Payer: 59 | Attending: Family Medicine | Admitting: Family Medicine

## 2019-07-10 DIAGNOSIS — O034 Incomplete spontaneous abortion without complication: Secondary | ICD-10-CM | POA: Insufficient documentation

## 2019-07-10 DIAGNOSIS — Z6790 Unspecified blood type, Rh positive: Secondary | ICD-10-CM | POA: Diagnosis not present

## 2019-07-10 DIAGNOSIS — O021 Missed abortion: Secondary | ICD-10-CM | POA: Insufficient documentation

## 2019-07-10 DIAGNOSIS — O26891 Other specified pregnancy related conditions, first trimester: Secondary | ICD-10-CM | POA: Diagnosis not present

## 2019-07-10 LAB — CBC
HCT: 30.2 % — ABNORMAL LOW (ref 36.0–46.0)
Hemoglobin: 11.1 g/dL — ABNORMAL LOW (ref 12.0–15.0)
MCH: 28 pg (ref 26.0–34.0)
MCHC: 36.8 g/dL — ABNORMAL HIGH (ref 30.0–36.0)
MCV: 76.1 fL — ABNORMAL LOW (ref 80.0–100.0)
Platelets: 217 10*3/uL (ref 150–400)
RBC: 3.97 MIL/uL (ref 3.87–5.11)
RDW: 13.1 % (ref 11.5–15.5)
WBC: 10.9 10*3/uL — ABNORMAL HIGH (ref 4.0–10.5)
nRBC: 0 % (ref 0.0–0.2)

## 2019-07-10 MED ORDER — LACTATED RINGERS IV BOLUS
1000.0000 mL | Freq: Once | INTRAVENOUS | Status: AC
  Administered 2019-07-10: 1000 mL via INTRAVENOUS

## 2019-07-10 MED ORDER — ONDANSETRON 4 MG PO TBDP
8.0000 mg | ORAL_TABLET | Freq: Three times a day (TID) | ORAL | Status: DC | PRN
  Administered 2019-07-10: 09:00:00 8 mg via ORAL
  Filled 2019-07-10: qty 2

## 2019-07-10 MED ORDER — OXYCODONE-ACETAMINOPHEN 5-325 MG PO TABS: 1.0000 | ORAL_TABLET | ORAL | 0 refills | Status: DC | PRN

## 2019-07-10 MED ORDER — MISOPROSTOL 200 MCG PO TABS
800.0000 ug | ORAL_TABLET | Freq: Once | ORAL | Status: AC
  Administered 2019-07-10: 800 ug via ORAL
  Filled 2019-07-10: qty 4

## 2019-07-10 MED ORDER — IBUPROFEN 600 MG PO TABS: 600.0000 mg | ORAL_TABLET | Freq: Four times a day (QID) | ORAL | 0 refills | Status: DC | PRN

## 2019-07-10 MED ORDER — PROMETHAZINE HCL 25 MG PO TABS
25.0000 mg | ORAL_TABLET | Freq: Four times a day (QID) | ORAL | Status: DC | PRN
  Administered 2019-07-10: 25 mg via ORAL
  Filled 2019-07-10: qty 1

## 2019-07-10 MED ORDER — HYDROMORPHONE HCL 1 MG/ML IJ SOLN
1.0000 mg | Freq: Once | INTRAMUSCULAR | Status: AC
  Administered 2019-07-10: 08:00:00 1 mg via INTRAMUSCULAR
  Filled 2019-07-10: qty 1

## 2019-07-10 MED ORDER — HYDROMORPHONE HCL 1 MG/ML IJ SOLN
1.0000 mg | INTRAMUSCULAR | Status: DC | PRN
  Administered 2019-07-10: 1 mg via INTRAVENOUS
  Filled 2019-07-10: qty 1

## 2019-07-10 MED ORDER — PROMETHAZINE HCL 25 MG PO TABS: 25.0000 mg | ORAL_TABLET | Freq: Four times a day (QID) | ORAL | 0 refills | Status: DC | PRN

## 2019-07-10 NOTE — Telephone Encounter (Signed)
Pt currently at [facility] for Evaluation.

## 2019-07-10 NOTE — MAU Provider Note (Signed)
History     CSN: [id number]  Arrival date and time: 07/10/19 2549   First Provider Initiated Contact with Patient 07/10/19 0809      Chief Complaint  Patient presents with  . Abdominal Pain  . Vaginal Bleeding   34 y.o. [id number] recently dx with MAB yesterday presenting with abdominal pain and VB after taking Cytotec. Reports increased pain around 3am after she took the first dose of Cytotec around midnight. She took Tramadol but it didn't help. Rates pain 9/10. She started to bleed heavier around 5am and soaked a pad over 1.5 hrs. She passed some clots into the toilet, unsure of size.   OB History    Gravida  5   Para  1   Term  1   Preterm      AB  3   Living  1     SAB  1   TAB  2   Ectopic      Multiple      Live Births  1           Past Medical History:  Diagnosis Date  . Eczema     History reviewed. No pertinent surgical history.  Family History  Problem Relation Age of Onset  . Allergic rhinitis Mother   . Allergic rhinitis Father   . Eczema Sister   . Eczema Brother     Social History   Tobacco Use  . Smoking status: Never Smoker  . Smokeless tobacco: Never Used  Substance Use Topics  . Alcohol use: Yes    Alcohol/week: 1.0 standard drinks    Types: 1 Glasses of wine per week    Comment: occ before pregnancy  . Drug use: No    Allergies: No Known Allergies  Medications Prior to Admission  Medication Sig Dispense Refill Last Dose  . cetirizine (ZYRTEC) 10 MG tablet Take 10 mg by mouth daily.   07/09/2019 at Unknown time  . misoprostol (CYTOTEC) 200 MCG tablet Place all 3 tablets in your cheek until dissolved to a paste then swallow. 3 tablet 0 07/10/2019 at Unknown time  . Prenat w/o A-FE-Methfol-FA-DHA (PRENATE DHA) 28-0.6-0.4-300 MG CAPS Take 1 tablet by mouth daily. 30 capsule 12 Past Week at Unknown time  . traMADol (ULTRAM) 50 MG tablet Take 1 tablet (50 mg total) by mouth every 6 (six) hours as needed. 10 tablet 0 07/10/2019 at  Unknown time  . Blood Pressure Monitoring (BLOOD PRESSURE KIT) DEVI 1 kit by Does not apply route as needed. 1 each 0 Unknown at Unknown time    Review of Systems  Gastrointestinal: Positive for abdominal pain.  Genitourinary: Positive for vaginal bleeding.   Physical Exam   Blood pressure (!) 115/56, pulse 81, temperature 98.3 F (36.8 C), temperature source Oral, resp. rate 16, last menstrual period 04/19/2019, SpO2 99 %.  Physical Exam  Nursing note and vitals reviewed. Constitutional: She is oriented to person, place, and time. She appears well-developed and well-nourished. No distress.  HENT:  Head: Normocephalic and atraumatic.  Cardiovascular: Normal rate.  Respiratory: Effort normal. No respiratory distress.  Genitourinary:    Genitourinary Comments: External: no lesions or erythema Vagina: rugated, pink, moist, large amount of blood and clots cleared from vault, POCs protruding from cervix and removed with ring forceps, vagina continuing to fill with blood and cleared several times Cervix 1/thick    Musculoskeletal:        General: Normal range of motion.     Cervical back: Normal  range of motion.  Neurological: She is alert and oriented to person, place, and time.  Skin: Skin is warm and dry.  Psychiatric: She has a normal mood and affect.   Results for orders placed or performed during the hospital encounter of 07/10/19 (from the past 24 hour(s))  CBC     Status: Abnormal   Collection Time: 07/10/19  8:38 AM  Result Value Ref Range   WBC 10.9 (H) 4.0 - 10.5 K/uL   RBC 3.97 3.87 - 5.11 MIL/uL   Hemoglobin 11.1 (L) 12.0 - 15.0 g/dL   HCT 30.2 (L) 36.0 - 46.0 %   MCV 76.1 (L) 80.0 - 100.0 fL   MCH 28.0 26.0 - 34.0 pg   MCHC 36.8 (H) 30.0 - 36.0 g/dL   RDW 13.1 11.5 - 15.5 %   Platelets 217 150 - 400 K/uL   nRBC 0.0 0.0 - 0.2 %    US OB Transvaginal  Result Date: 07/10/2019 CLINICAL DATA:  Vaginal bleeding, pain. Missed abortion. Rule out retained products of  conception. EXAM: TRANSVAGINAL OB ULTRASOUND TECHNIQUE: Transvaginal ultrasound was performed for complete evaluation of the gestation as well as the maternal uterus, adnexal regions, and pelvic cul-de-sac. COMPARISON:  07/07/2019 FINDINGS: Intrauterine gestational sac: None Yolk sac:  Not visualized Embryo:  Not visualized Cardiac Activity: Not visualized Heart Rate:  bpm MSD:   mm    w     d CRL:     mm    w  d                  [place] EDC: Subchorionic hemorrhage:  In/a Maternal uterus/adnexae: Thickened, heterogeneous endometrium. This measures up to 2.3 cm in thickness in the lower uterine segment. Appearance is concerning for retained products of conception. IMPRESSION: No visible intrauterine pregnancy. Thickened, heterogeneous endometrium concerning for retained products of conception. Electronically Signed   By: Rolm Baptise M.D.   On: 07/10/2019 10:10   MAU Course  Procedures Meds ordered this encounter  Medications  . HYDROmorphone (DILAUDID) injection 1 mg  . DISCONTD: ondansetron (ZOFRAN-ODT) disintegrating tablet 8 mg  . misoprostol (CYTOTEC) tablet 800 mcg  . lactated ringers bolus 1,000 mL  . HYDROmorphone (DILAUDID) injection 1 mg  . promethazine (PHENERGAN) tablet 25 mg  . promethazine (PHENERGAN) 25 MG tablet    Sig: Take 1 tablet (25 mg total) by mouth every 6 (six) hours as needed for nausea or vomiting.    Dispense:  30 tablet    Refill:  0    Order Specific Question:   Supervising Provider    Answer:   Donnamae Jude [[id number]]  . ibuprofen (ADVIL) 600 MG tablet    Sig: Take 1 tablet (600 mg total) by mouth every 6 (six) hours as needed for cramping.    Dispense:  30 tablet    Refill:  0    Order Specific Question:   Supervising Provider    Answer:   Donnamae Jude [[id number]]  . oxyCODONE-acetaminophen (PERCOCET/ROXICET) 5-325 MG tablet    Sig: Take 1 tablet by mouth every 4 (four) hours as needed for severe pain.    Dispense:  6 tablet    Refill:  0    Order Specific Question:    Supervising Provider    Answer:   Merrily Pew   MDM Labs and [place] ordered and reviewed. Another dose of Cytotec ordered for increased bleeding.  1100: Discussed presentation and clinical findings with Dr. Kennon Rounds 1120: Reassessment-bleeding  and pain minimal. Discussed results with pt. Plan for outpt mngt. Stable for discharge home.   Assessment and Plan   1. Missed abortion   2. Retained products of conception after miscarriage   3. Blood type, Rh positive    Discharge home Follow up at [facility] in 1-2 weeks OOW  x4 days Strict return precautions Rx Ibuprofen Rx Phenergan Rx Percocet  Allergies as of 07/10/2019   No Known Allergies     Medication List    STOP taking these medications   Blood Pressure Kit Devi   misoprostol 200 MCG tablet Commonly known as: CYTOTEC   traMADol 50 MG tablet Commonly known as: ULTRAM     TAKE these medications   cetirizine 10 MG tablet Commonly known as: ZYRTEC Take 10 mg by mouth daily.   ibuprofen 600 MG tablet Commonly known as: ADVIL Take 1 tablet (600 mg total) by mouth every 6 (six) hours as needed for cramping.   oxyCODONE-acetaminophen 5-325 MG tablet Commonly known as: PERCOCET/ROXICET Take 1 tablet by mouth every 4 (four) hours as needed for severe pain.   Prenate DHA 28-0.6-0.4-300 MG Caps Take 1 tablet by mouth daily.   promethazine 25 MG tablet Commonly known as: PHENERGAN Take 1 tablet (25 mg total) by mouth every 6 (six) hours as needed for nausea or vomiting.      Julianne Handler, CNM 07/10/2019, 11:36 AM

## 2019-07-10 NOTE — Progress Notes (Signed)
Pt vomited after phenergan, unsure if vomited phenergan up. Also, noted mod amt of clotted vaginal bleeding on floor during vomiting.  CNM notified, orders rec'd to proceed with d/c home & instruct pt to pick up prescribed meds & take phenergan at home.

## 2019-07-10 NOTE — Discharge Instructions (Signed)

## 2019-07-10 NOTE — MAU Note (Signed)
Patient reports to MAU c/o heavy vaginal bleeding and abdominal pain after taking cytotec. Pt states she took it around midnight and is now saturating a pad every hour. Pt states she has not urinated or had a BM since then. Pt also took tramadol with minimal relief.

## 2019-07-11 LAB — SURGICAL PATHOLOGY

## 2019-07-17 ENCOUNTER — Other Ambulatory Visit: Payer: Self-pay | Admitting: Advanced Practice Midwife

## 2019-07-17 DIAGNOSIS — O039 Complete or unspecified spontaneous abortion without complication: Secondary | ICD-10-CM

## 2019-07-22 ENCOUNTER — Ambulatory Visit: Payer: 59 | Admitting: Licensed Clinical Social Worker

## 2019-07-22 ENCOUNTER — Encounter: Payer: Self-pay | Admitting: Advanced Practice Midwife

## 2019-07-22 ENCOUNTER — Other Ambulatory Visit: Payer: Self-pay

## 2019-07-22 ENCOUNTER — Ambulatory Visit (INDEPENDENT_AMBULATORY_CARE_PROVIDER_SITE_OTHER): Payer: 59 | Admitting: Advanced Practice Midwife

## 2019-07-22 VITALS — BP 113/75 | HR 86 | Ht 63.0 in | Wt 176.0 lb

## 2019-07-22 DIAGNOSIS — O039 Complete or unspecified spontaneous abortion without complication: Secondary | ICD-10-CM

## 2019-07-22 DIAGNOSIS — F419 Anxiety disorder, unspecified: Secondary | ICD-10-CM

## 2019-07-22 NOTE — Patient Instructions (Signed)

## 2019-07-22 NOTE — Progress Notes (Signed)
  GYNECOLOGY PROGRESS NOTE  History:  34 y.o. [id number] presents to [facility] office today for problem gyn visit. She reports .  She denies h/a, dizziness, shortness of breath, n/v, or fever/chills.    The following portions of the patient's history were reviewed and updated as appropriate: allergies, current medications, past family history, past medical history, past social history, past surgical history and problem list. Last pap smear on 05/16/2018 was normal, neg HRHPV.  Review of Systems:  Pertinent items are noted in HPI.   Objective:  Physical Exam Blood pressure 113/75, pulse 86, height 5\' 3"  (1.6 m), weight 176 lb (79.8 kg), last menstrual period 04/19/2019, unknown if currently breastfeeding. VS reviewed, nursing note reviewed,  Constitutional: well developed, well nourished, no distress HEENT: normocephalic CV: normal rate Pulm/chest wall: normal effort Breast Exam: deferred Abdomen: soft Neuro: alert and oriented x 3 Skin: warm, dry Psych: affect normal Pelvic exam: Deferred  Assessment & Plan:  1. SAB (spontaneous abortion) --Doing well, still having mild cramping and light bleeding not requiring a pad. --Desires pregnancy soon.  Discussed waiting for quant hcg results, recommend wait for at least one period. --Pt with 2 miscarriages, one term pregnancy, hx 2 TABs.  Wants to know what she can do now to test her health and what to do in future pregnancy. - Beta hCG quant (ref lab)   04/21/2019, CNM 2:43 PM

## 2019-07-22 NOTE — BH Specialist Note (Signed)
CSW A. Linton Rump met with patient regarding SAB 07/10/2019. Patient reports anxiety symptoms such as worry, anxious and constant thoughts of SAB. Patient reports she is managing symptoms with deep breathing exercises and talking with fiance and friends. Patient reports she has a good support system. Patient is advised to continue with managing symptoms and discuss pregnancy options with CNM [place].

## 2019-07-22 NOTE — Progress Notes (Signed)
GYN presents for FU after SAB.  C/o cramps 5/10 x 2 weeks

## 2019-07-23 LAB — BETA HCG QUANT (REF LAB): hCG Quant: 67 m[IU]/mL

## 2019-07-29 ENCOUNTER — Other Ambulatory Visit: Payer: 59

## 2019-07-30 ENCOUNTER — Other Ambulatory Visit: Payer: Self-pay

## 2019-07-30 ENCOUNTER — Other Ambulatory Visit: Payer: 59

## 2019-07-30 DIAGNOSIS — O039 Complete or unspecified spontaneous abortion without complication: Secondary | ICD-10-CM

## 2019-07-31 LAB — BETA HCG QUANT (REF LAB): hCG Quant: 38 m[IU]/mL

## 2019-08-04 ENCOUNTER — Ambulatory Visit: Payer: 59 | Admitting: Clinical

## 2019-08-04 ENCOUNTER — Other Ambulatory Visit: Payer: Self-pay

## 2019-08-04 DIAGNOSIS — Z91199 Patient's noncompliance with other medical treatment and regimen due to unspecified reason: Secondary | ICD-10-CM

## 2019-08-04 DIAGNOSIS — Z5329 Procedure and treatment not carried out because of patient's decision for other reasons: Secondary | ICD-10-CM

## 2019-08-04 NOTE — BH Specialist Note (Signed)
Pt did not arrive to video visit and did not answer the phone ; Left HIPPA-compliant message to call back Asher Muir from [facility] at [phone].  ; left MyChart message for patient.

## 2019-08-06 ENCOUNTER — Other Ambulatory Visit: Payer: Self-pay

## 2019-08-06 ENCOUNTER — Other Ambulatory Visit: Payer: 59

## 2019-08-06 DIAGNOSIS — O039 Complete or unspecified spontaneous abortion without complication: Secondary | ICD-10-CM

## 2019-08-07 LAB — BETA HCG QUANT (REF LAB): hCG Quant: 16 m[IU]/mL

## 2019-08-13 ENCOUNTER — Other Ambulatory Visit: Payer: Self-pay

## 2019-08-13 ENCOUNTER — Other Ambulatory Visit: Payer: 59

## 2019-08-13 DIAGNOSIS — O039 Complete or unspecified spontaneous abortion without complication: Secondary | ICD-10-CM

## 2019-08-14 LAB — BETA HCG QUANT (REF LAB): hCG Quant: 4 m[IU]/mL

## 2019-08-18 NOTE — BH Specialist Note (Signed)
Integrated Behavioral Health via Telemedicine Video Visit  08/18/2019 Molly Dawson [id number]  Number of Integrated Behavioral Health visits: 1 Session Start time: 1:12  Session End time: 1:41 Total time: 29  Referring Provider: Sharen Counter, CNM Type of Visit: Video Patient/Family location: Home [facility] Provider location: [facility] All persons participating in visit: Patient Molly Dawson and [facility] Zanai Mallari    Confirmed patient's address: Yes  Confirmed patient's phone number: Yes  Any changes to demographics: No   Confirmed patient's insurance: Yes  Any changes to patient's insurance: No   Discussed confidentiality: Yes   I connected with Molly Dawson  by a video enabled telemedicine application and verified that I am speaking with the correct person using two identifiers.     I discussed the limitations of evaluation and management by telemedicine and the availability of in person appointments.  I discussed that the purpose of this visit is to provide behavioral health care while limiting exposure to the novel coronavirus.   Discussed there is a possibility of technology failure and discussed alternative modes of communication if that failure occurs.  I discussed that engaging in this video visit, they consent to the provision of behavioral healthcare and the services will be billed under their insurance.  Patient and/or legal guardian expressed understanding and consented to video visit: Yes   PRESENTING CONCERNS: Patient and/or family reports the following symptoms/concerns: Pt states her primary concern is grieving after miscarriage; copes by staying busy and doing deep breathing exercises.  Duration of problem: Over 6 weeks; Severity of problem: -  STRENGTHS (Protective Factors/Coping Skills): Supportive family  GOALS ADDRESSED: Patient will: 1.  Demonstrate ability to: Continue healthy grieving over loss  INTERVENTIONS: Interventions utilized:   Supportive Counseling and Link to [employer] Standardized Assessments completed: Not needed  ASSESSMENT: Patient currently experiencing Grief.   Patient may benefit from psychoeducation and brief therapeutic interventions regarding coping with symptoms of normal grief .  PLAN: 1. Follow up with behavioral health clinician on : One month 2. Behavioral recommendations:  -Continue allowing self time to grieve loss -Consider grief resources (on After Visit Summary) as needed 3. Referral(s): Community Resources:  Grief  I discussed the assessment and treatment plan with the patient and/or parent/guardian. They were provided an opportunity to ask questions and all were answered. They agreed with the plan and demonstrated an understanding of the instructions.   They were advised to call back or seek an in-person evaluation if the symptoms worsen or if the condition fails to improve as anticipated.  Molly Dawson

## 2019-08-20 ENCOUNTER — Other Ambulatory Visit: Payer: Self-pay

## 2019-08-20 ENCOUNTER — Ambulatory Visit (INDEPENDENT_AMBULATORY_CARE_PROVIDER_SITE_OTHER): Payer: 59 | Admitting: Clinical

## 2019-08-20 ENCOUNTER — Telehealth: Payer: Self-pay

## 2019-08-20 DIAGNOSIS — F4321 Adjustment disorder with depressed mood: Secondary | ICD-10-CM | POA: Diagnosis not present

## 2019-08-20 NOTE — Patient Instructions (Signed)
Hello [name], These are resources that have been helpful to women and families experiencing grief:   [place] virtual bereavement support group will start November 11th 630p to 730p and will be facilitated by pastoral care and perinatal education.  To start, this group will meet once a month. The registration location for this support group is on [place]'s website > your wellbeing > classes and support groups > support groups   Also:  www.BrideEmporium.nl  www.nationalshare.org  www.missfoundation.org  www.nilmdts.org        www.stillstandingmag.com  www.rtzhope.org   Www.postpartum.net  Www.[facility].org [facility] [place] location at: [phone] (Grief support)   /Emotional Wellbeing Apps and Websites Here are a few free apps meant to help you to help yourself.  To find, try searching on the internet to see if the app is offered on Apple/Android devices. If your first choice doesn't come up on your device, the good news is that there are many choices! Play around with different apps to see which ones are helpful to you.    Calm This is an app meant to help increase calm feelings. Includes info, strategies, and tools for tracking your feelings.      Calm Harm  This app is meant to help with self-harm. Provides many 5-minute or 15-min coping strategies for doing instead of hurting yourself.       Healthy Minds Health Minds is a problem-solving tool to help deal with emotions and cope with stress you encounter wherever you are.      MindShift This app can help people cope with anxiety. Rather than trying to avoid anxiety, you can make an important shift and face it.      MY3  MY3 features a support system, safety plan and resources with the goal of offering a tool to use in a time of need.       My Life My Voice  This mood journal offers a simple solution for tracking your thoughts, feelings and moods. Animated emoticons can help identify your mood.       Relax Melodies Designed to help with sleep, on this app you can mix sounds and meditations for relaxation.      Smiling Mind Smiling Mind is meditation made easy: it's a simple tool that helps put a smile on your mind.        Stop, Breathe & Think  A friendly, simple guide for people through meditations for mindfulness and compassion.  Stop, Breathe and Think Kids Enter your current feelings and choose a "mission" to help you cope. Offers videos for certain moods instead of just sound recordings.       Team Orange The goal of this tool is to help teens change how they think, act, and react. This app helps you focus on your own good feelings and experiences.      The [employer] Box The [employer] Box (VHB) contains simple tools to help patients with coping, relaxation, distraction, and positive thinking.

## 2019-08-20 NOTE — Telephone Encounter (Signed)
[facility] pt had appt with [facility] today and requested that a nurse call her in regards to a few questions. Pt would like to know if she needs a follow up appt. Per chart review, Constant, MD would like pt to return for birth control counseling or she may continue taking prenatal vitamins if she is trying to conceive. Pt states she would like to continue trying to conceive. Explained f/u to pt and encouraged pt to make an appt with [facility] office to talk about future pregnancies if this would be helpful.

## 2019-09-12 ENCOUNTER — Ambulatory Visit: Payer: 59 | Admitting: Licensed Clinical Social Worker

## 2019-09-12 DIAGNOSIS — Z5329 Procedure and treatment not carried out because of patient's decision for other reasons: Secondary | ICD-10-CM

## 2019-09-12 DIAGNOSIS — Z91199 Patient's noncompliance with other medical treatment and regimen due to unspecified reason: Secondary | ICD-10-CM

## 2019-09-12 NOTE — BH Specialist Note (Signed)
Patient requested to have virtual visit rescheduled.

## 2019-09-17 ENCOUNTER — Ambulatory Visit: Payer: 59

## 2020-07-12 ENCOUNTER — Encounter: Payer: Self-pay | Admitting: Obstetrics

## 2020-07-12 ENCOUNTER — Other Ambulatory Visit: Payer: Self-pay

## 2020-07-12 ENCOUNTER — Ambulatory Visit (INDEPENDENT_AMBULATORY_CARE_PROVIDER_SITE_OTHER): Payer: 59 | Admitting: Obstetrics

## 2020-07-12 VITALS — BP 114/79 | HR 81 | Wt 167.5 lb

## 2020-07-12 DIAGNOSIS — Z113 Encounter for screening for infections with a predominantly sexual mode of transmission: Secondary | ICD-10-CM

## 2020-07-12 NOTE — Progress Notes (Addendum)
Patient ID: Molly Dawson, female   DOB: 1986-03-22, 35 y.o.   MRN: [id number]  Chief Complaint  Patient presents with  . Exposure to STD    HPI Molly Dawson is a 35 y.o. female.  Patient wants testing to know if she has been exposed to herpes.  Her boyfriend thinks that he may have been ex[posed but he has never had an outbreak.  She has never had an outbreak either. HPI  Past Medical History:  Diagnosis Date  . Eczema     History reviewed. No pertinent surgical history.  Family History  Problem Relation Age of Onset  . Allergic rhinitis Mother   . Allergic rhinitis Father   . Eczema Sister   . Eczema Brother     Social History Social History   Tobacco Use  . Smoking status: Never Smoker  . Smokeless tobacco: Never Used  Vaping Use  . Vaping Use: Never used  Substance Use Topics  . Alcohol use: Yes    Alcohol/week: 1.0 standard drink    Types: 1 Glasses of wine per week    Comment: occ before pregnancy  . Drug use: No    No Known Allergies  Current Outpatient Medications  Medication Sig Dispense Refill  . cetirizine (ZYRTEC) 10 MG tablet Take 10 mg by mouth daily.    Marland Kitchen ibuprofen (ADVIL) 600 MG tablet Take 1 tablet (600 mg total) by mouth every 6 (six) hours as needed for cramping. 30 tablet 0  . oxyCODONE-acetaminophen (PERCOCET/ROXICET) 5-325 MG tablet Take 1 tablet by mouth every 4 (four) hours as needed for severe pain. (Patient not taking: Reported on 07/22/2019) 6 tablet 0  . Prenat w/o A-FE-Methfol-FA-DHA (PRENATE DHA) 28-0.6-0.4-300 MG CAPS Take 1 tablet by mouth daily. 30 capsule 12  . promethazine (PHENERGAN) 25 MG tablet Take 1 tablet (25 mg total) by mouth every 6 (six) hours as needed for nausea or vomiting. 30 tablet 0   No current facility-administered medications for this visit.    Review of Systems Review of Systems Constitutional: negative for fatigue and weight loss Respiratory: negative for cough and wheezing Cardiovascular: negative for  chest pain, fatigue and palpitations Gastrointestinal: negative for abdominal pain and change in bowel habits Genitourinary:negative Integument/breast: negative for nipple discharge Musculoskeletal:negative for myalgias Neurological: negative for gait problems and tremors Behavioral/Psych: negative for abusive relationship, depression Endocrine: negative for temperature intolerance      Blood pressure 114/79, pulse 81, weight 167 lb 8 oz (76 kg), unknown if currently breastfeeding.  Physical Exam Physical Exam:  Deferred  50% of 15 min visit spent on counseling and coordination of care.   Data Reviewed Labs  Assessment     1. Screening examination for STD (sexually transmitted disease) Rx: - HSV Type I/II IgG, IgMw/ reflex  Plan   Follow up in 3 months for annual / pap  Orders Placed This Encounter  Procedures  . HSV Type I/II IgG, IgMw/ reflex      Brock Bad, MD 07/12/2020 10:23 AM

## 2020-07-12 NOTE — Progress Notes (Signed)
Patient presents for HSV testing. She denies having any lesions, but states that a recent partner has tested positive by blood work. Her partner has never had any lesions.

## 2020-07-12 NOTE — Addendum Note (Signed)
Addended by: Coral Ceo A on: 07/12/2020 10:44 AM   Modules accepted: Orders

## 2020-07-14 LAB — HSV(HERPES SIMPLEX VRS) I + II AB-IGM: HSVI/II Comb IgM: <0.91 Ratio (ref 0.00–0.90)

## 2020-08-30 ENCOUNTER — Ambulatory Visit: Payer: 59 | Admitting: Obstetrics

## 2020-09-15 ENCOUNTER — Ambulatory Visit (INDEPENDENT_AMBULATORY_CARE_PROVIDER_SITE_OTHER): Payer: 59 | Admitting: Obstetrics

## 2020-09-15 ENCOUNTER — Other Ambulatory Visit: Payer: Self-pay

## 2020-09-15 ENCOUNTER — Other Ambulatory Visit (HOSPITAL_COMMUNITY)
Admission: RE | Admit: 2020-09-15 | Discharge: 2020-09-15 | Disposition: A | Discharge status: Home / Self Care | Payer: 59 | Source: Ambulatory Visit | Attending: Obstetrics | Admitting: Obstetrics

## 2020-09-15 ENCOUNTER — Encounter: Payer: Self-pay | Admitting: Obstetrics

## 2020-09-15 VITALS — BP 104/63 | HR 75 | Ht 63.0 in | Wt 170.0 lb

## 2020-09-15 DIAGNOSIS — N760 Acute vaginitis: Secondary | ICD-10-CM | POA: Diagnosis not present

## 2020-09-15 DIAGNOSIS — B9689 Other specified bacterial agents as the cause of diseases classified elsewhere: Secondary | ICD-10-CM

## 2020-09-15 DIAGNOSIS — N898 Other specified noninflammatory disorders of vagina: Secondary | ICD-10-CM | POA: Insufficient documentation

## 2020-09-15 DIAGNOSIS — Z113 Encounter for screening for infections with a predominantly sexual mode of transmission: Secondary | ICD-10-CM

## 2020-09-15 DIAGNOSIS — Z01419 Encounter for gynecological examination (general) (routine) without abnormal findings: Secondary | ICD-10-CM | POA: Insufficient documentation

## 2020-09-15 DIAGNOSIS — Z3169 Encounter for other general counseling and advice on procreation: Secondary | ICD-10-CM

## 2020-09-15 MED ORDER — METRONIDAZOLE 500 MG PO TABS: 500.0000 mg | ORAL_TABLET | Freq: Two times a day (BID) | ORAL | 2 refills | Status: DC

## 2020-09-15 MED ORDER — PRENATE DHA 18-0.6-0.4-300 MG PO CAPS: 1.0000 | ORAL_CAPSULE | Freq: Every day | ORAL | 11 refills | Status: DC

## 2020-09-15 NOTE — Progress Notes (Signed)
Subjective:        Molly Dawson is a 35 y.o. female here for a routine exam.  Current complaints: Vaginal discharge.    Personal health questionnaire:  Is patient Ashkenazi Jewish, have a family history of breast and/or ovarian cancer: no Is there a family history of uterine cancer diagnosed at age < 22, gastrointestinal cancer, urinary tract cancer, family member who is a Personnel officer syndrome-associated carrier: no Is the patient overweight and hypertensive, family history of diabetes, personal history of gestational diabetes, preeclampsia or PCOS: no Is patient over 77, have PCOS,  family history of premature CHD under age 6, diabetes, smoke, have hypertension or peripheral artery disease:  no At any time, has a partner hit, kicked or otherwise hurt or frightened you?: no Over the past 2 weeks, have you felt down, depressed or hopeless?: no Over the past 2 weeks, have you felt little interest or pleasure in doing things?:no   Gynecologic History No LMP recorded. Contraception: none Last Pap: 2019. Results were: normal Last mammogram: n/a. Results were: n/a  Obstetric History OB History  Gravida Para Term Preterm AB Living  5 1 1   4 1   SAB IAB Ectopic Multiple Live Births  2 2     1     # Outcome Date GA Lbr Len/2nd Weight Sex Delivery Anes PTL Lv  5 Term 01/15/09     Vag-Spont   LIV  4 SAB 2009 [redacted]w[redacted]d         3 IAB 2007          2 IAB 2005          1 SAB             Past Medical History:  Diagnosis Date  . Eczema     History reviewed. No pertinent surgical history.   Current Outpatient Medications:  .  metroNIDAZOLE (FLAGYL) 500 MG tablet, Take 1 tablet (500 mg total) by mouth 2 (two) times daily., Disp: 14 tablet, Rfl: 2 .  Prenat-FeAsp-Meth-FA-DHA w/o A (PRENATE DHA) 18-0.6-0.4-300 MG CAPS, Take 1 capsule by mouth daily before breakfast., Disp: 30 capsule, Rfl: 11 .  cetirizine (ZYRTEC) 10 MG tablet, Take 10 mg by mouth daily., Disp: , Rfl:  .  ibuprofen (ADVIL) 600  MG tablet, Take 1 tablet (600 mg total) by mouth every 6 (six) hours as needed for cramping., Disp: 30 tablet, Rfl: 0 .  oxyCODONE-acetaminophen (PERCOCET/ROXICET) 5-325 MG tablet, Take 1 tablet by mouth every 4 (four) hours as needed for severe pain. (Patient not taking: Reported on 07/22/2019), Disp: 6 tablet, Rfl: 0 .  Prenat w/o A-FE-Methfol-FA-DHA (PRENATE DHA) 28-0.6-0.4-300 MG CAPS, Take 1 tablet by mouth daily., Disp: 30 capsule, Rfl: 12 .  promethazine (PHENERGAN) 25 MG tablet, Take 1 tablet (25 mg total) by mouth every 6 (six) hours as needed for nausea or vomiting., Disp: 30 tablet, Rfl: 0 No Known Allergies  Social History   Tobacco Use  . Smoking status: Never Smoker  . Smokeless tobacco: Never Used  Substance Use Topics  . Alcohol use: Yes    Alcohol/week: 1.0 standard drink    Types: 1 Glasses of wine per week    Comment: occ before pregnancy    Family History  Problem Relation Age of Onset  . Allergic rhinitis Mother   . Allergic rhinitis Father   . Eczema Sister   . Eczema Brother       Review of Systems  Constitutional: negative for fatigue and weight loss  Respiratory: negative for cough and wheezing Cardiovascular: negative for chest pain, fatigue and palpitations Gastrointestinal: negative for abdominal pain and change in bowel habits Musculoskeletal:negative for myalgias Neurological: negative for gait problems and tremors Behavioral/Psych: negative for abusive relationship, depression Endocrine: negative for temperature intolerance    Genitourinary:negative for abnormal menstrual periods, genital lesions, hot flashes, sexual problems.  Positive for vaginal discharge Integument/breast: negative for breast lump, breast tenderness, nipple discharge and skin lesion(s)    Objective:       BP 104/63   Pulse 75   Ht 5\' 3"  (1.6 m)   Wt 170 lb (77.1 kg)   BMI 30.11 kg/m  General:   alert and no distress  Skin:   no rash or abnormalities  Lungs:   clear to  auscultation bilaterally  Heart:   regular rate and rhythm, S1, S2 normal, no murmur, click, rub or gallop  Breasts:   normal without suspicious masses, skin or nipple changes or axillary nodes  Abdomen:  normal findings: no organomegaly, soft, non-tender and no hernia  Pelvis:  External genitalia: normal general appearance Urinary system: urethral meatus normal and bladder without fullness, nontender Vaginal: normal without tenderness, induration or masses Cervix: normal appearance Adnexa: normal bimanual exam Uterus: anteverted and non-tender, normal size   Lab Review Urine pregnancy test Labs reviewed yes Radiologic studies reviewed no  50% of 20 min visit spent on counseling and coordination of care.   Assessment:     1. Encounter for gynecological examination with Papanicolaou smear of cervix Rx: - Cytology - PAP( [facility])  2. Vaginal discharge Rx: - Cervicovaginal ancillary only( [facility])  3. BV (bacterial vaginosis) Rx: - metroNIDAZOLE (FLAGYL) 500 MG tablet; Take 1 tablet (500 mg total) by mouth 2 (two) times daily.  Dispense: 14 tablet; Refill: 2  4. Screening for STD (sexually transmitted disease) Rx: - RPR+HBsAg+HCVAb+...  5. Encounter for preconception consultation Rx: - Prenat-FeAsp-Meth-FA-DHA w/o A (PRENATE DHA) 18-0.6-0.4-300 MG CAPS; Take 1 capsule by mouth daily before breakfast.  Dispense: 30 capsule; Refill: 11    Plan:    Education reviewed: calcium supplements, depression evaluation, low fat, low cholesterol diet, safe sex/STD prevention, self breast exams and weight bearing exercise. Contraception: none. Follow up in: 1 year.   Meds ordered this encounter  Medications  . metroNIDAZOLE (FLAGYL) 500 MG tablet    Sig: Take 1 tablet (500 mg total) by mouth 2 (two) times daily.    Dispense:  14 tablet    Refill:  2  . Prenat-FeAsp-Meth-FA-DHA w/o A (PRENATE DHA) 18-0.6-0.4-300 MG CAPS    Sig: Take 1 capsule by mouth daily before  breakfast.    Dispense:  30 capsule    Refill:  11   Orders Placed This Encounter  Procedures  . RPR+HBsAg+HCVAb+...    12-13-1984, MD 09/15/2020 10:55 AM

## 2020-09-15 NOTE — Progress Notes (Signed)
Patient presents for Annual Exam  LMP:08/09/20-03-04/22  Contraception: None ,none desired  STD Screening: Desires Full Panel  Last pap:05/16/2018  WNL  Family Hx of Breast Cancer: None   CC: None

## 2020-09-16 LAB — CERVICOVAGINAL ANCILLARY ONLY
Bacterial Vaginitis (gardnerella): POSITIVE — AB
Candida Glabrata: NEGATIVE
Candida Vaginitis: NEGATIVE
Chlamydia: POSITIVE — AB
Comment: NEGATIVE
Comment: NEGATIVE
Comment: NEGATIVE
Comment: NEGATIVE
Comment: NEGATIVE
Comment: NORMAL
Neisseria Gonorrhea: NEGATIVE
Trichomonas: NEGATIVE

## 2020-09-16 LAB — RPR+HBSAG+HCVAB+...
HIV Screen 4th Generation wRfx: NONREACTIVE
Hep C Virus Ab: 0.1 s/co ratio (ref 0.0–0.9)
Hepatitis B Surface Ag: NEGATIVE
RPR Ser Ql: NONREACTIVE

## 2020-09-17 ENCOUNTER — Other Ambulatory Visit: Payer: Self-pay | Admitting: Obstetrics

## 2020-09-17 DIAGNOSIS — A749 Chlamydial infection, unspecified: Secondary | ICD-10-CM

## 2020-09-17 LAB — CYTOLOGY - PAP
Comment: NEGATIVE
Diagnosis: NEGATIVE
Diagnosis: REACTIVE
High risk HPV: NEGATIVE

## 2020-09-17 MED ORDER — DOXYCYCLINE HYCLATE 100 MG PO CAPS: 100.0000 mg | ORAL_CAPSULE | Freq: Two times a day (BID) | ORAL | 0 refills | Status: DC

## 2020-10-27 ENCOUNTER — Other Ambulatory Visit (HOSPITAL_COMMUNITY)
Admission: RE | Admit: 2020-10-27 | Discharge: 2020-10-27 | Disposition: A | Discharge status: Home / Self Care | Payer: 59 | Source: Ambulatory Visit | Attending: Obstetrics and Gynecology | Admitting: Obstetrics and Gynecology

## 2020-10-27 ENCOUNTER — Ambulatory Visit (INDEPENDENT_AMBULATORY_CARE_PROVIDER_SITE_OTHER): Payer: 59

## 2020-10-27 ENCOUNTER — Other Ambulatory Visit: Payer: Self-pay

## 2020-10-27 DIAGNOSIS — A749 Chlamydial infection, unspecified: Secondary | ICD-10-CM | POA: Diagnosis not present

## 2020-10-27 NOTE — Progress Notes (Signed)
Agree with A & P. 

## 2020-10-27 NOTE — Progress Notes (Signed)
Nurse visit for [facility] Pt completed treatment and reports partner was treated as well.

## 2020-10-28 LAB — CERVICOVAGINAL ANCILLARY ONLY
Chlamydia: NEGATIVE
Comment: NEGATIVE
Comment: NORMAL
Neisseria Gonorrhea: NEGATIVE

## 2021-07-03 DIAGNOSIS — Z419 Encounter for procedure for purposes other than remedying health state, unspecified: Secondary | ICD-10-CM | POA: Diagnosis not present

## 2021-07-13 ENCOUNTER — Telehealth: Payer: Self-pay | Admitting: Obstetrics and Gynecology

## 2021-07-13 DIAGNOSIS — A749 Chlamydial infection, unspecified: Secondary | ICD-10-CM

## 2021-07-13 MED ORDER — DOXYCYCLINE HYCLATE 100 MG PO CAPS: 100.0000 mg | ORAL_CAPSULE | Freq: Two times a day (BID) | ORAL | 0 refills | Status: AC

## 2021-07-13 NOTE — Telephone Encounter (Signed)
Established patient called stating her partner tested positive for chlamydia.  She was already scheduled for self swab for 07/18/21.   She is requesting prescription.   Rx routed to pharmacy per protocol.    Patient does still want to come Monday for testing for full STI screen.

## 2021-07-18 ENCOUNTER — Other Ambulatory Visit: Payer: Self-pay

## 2021-07-18 ENCOUNTER — Ambulatory Visit (INDEPENDENT_AMBULATORY_CARE_PROVIDER_SITE_OTHER): Payer: 59

## 2021-07-18 ENCOUNTER — Other Ambulatory Visit (HOSPITAL_COMMUNITY)
Admission: RE | Admit: 2021-07-18 | Discharge: 2021-07-18 | Disposition: A | Discharge status: Home / Self Care | Payer: 59 | Source: Ambulatory Visit | Attending: Obstetrics and Gynecology | Admitting: Obstetrics and Gynecology

## 2021-07-18 VITALS — BP 111/73 | HR 85 | Ht 63.0 in | Wt 176.0 lb

## 2021-07-18 DIAGNOSIS — N898 Other specified noninflammatory disorders of vagina: Secondary | ICD-10-CM | POA: Diagnosis present

## 2021-07-18 DIAGNOSIS — Z113 Encounter for screening for infections with a predominantly sexual mode of transmission: Secondary | ICD-10-CM | POA: Diagnosis present

## 2021-07-18 NOTE — Progress Notes (Signed)
SUBJECTIVE:  36 y.o. female complains of clear vaginal discharge for 1 day(s). Denies abnormal vaginal bleeding or significant pelvic pain or fever. No UTI symptoms. Patient did have exposure to STD recently.  Patient's last menstrual period was 07/13/2021 (exact date).  OBJECTIVE:  She appears well, afebrile. Urine dipstick: not done.  ASSESSMENT:  Vaginal Discharge    PLAN:  GC, chlamydia, trichomonas, BVAG, CVAG probe sent to lab. Treatment: To be determined once lab results are received ROV prn if symptoms persist or worsen.

## 2021-07-19 ENCOUNTER — Encounter: Payer: Self-pay | Admitting: Obstetrics

## 2021-07-19 LAB — CERVICOVAGINAL ANCILLARY ONLY
Bacterial Vaginitis (gardnerella): POSITIVE — AB
Candida Glabrata: NEGATIVE
Candida Vaginitis: NEGATIVE
Chlamydia: NEGATIVE
Comment: NEGATIVE
Comment: NEGATIVE
Comment: NEGATIVE
Comment: NEGATIVE
Comment: NEGATIVE
Comment: NORMAL
Neisseria Gonorrhea: NEGATIVE
Trichomonas: NEGATIVE

## 2021-07-19 LAB — RPR+HBSAG+HCVAB+...
HIV Screen 4th Generation wRfx: NONREACTIVE
Hep C Virus Ab: <0.1 s/co ratio (ref 0.0–0.9)
Hepatitis B Surface Ag: NEGATIVE
RPR Ser Ql: NONREACTIVE

## 2021-07-20 ENCOUNTER — Telehealth: Payer: Self-pay

## 2021-07-20 NOTE — Telephone Encounter (Signed)
Returned call about BV treatment, advised pt of refills on metronidazole at the pharmacy.

## 2021-08-03 DIAGNOSIS — Z419 Encounter for procedure for purposes other than remedying health state, unspecified: Secondary | ICD-10-CM | POA: Diagnosis not present

## 2021-08-31 DIAGNOSIS — Z419 Encounter for procedure for purposes other than remedying health state, unspecified: Secondary | ICD-10-CM | POA: Diagnosis not present

## 2021-10-01 DIAGNOSIS — Z419 Encounter for procedure for purposes other than remedying health state, unspecified: Secondary | ICD-10-CM | POA: Diagnosis not present

## 2021-10-08 IMAGING — US US OB < 14 WEEKS - US OB TV
1 series · 15 of 28 positions shown · non-contrast
Comparison: None.

CLINICAL DATA: No fetal heart tones detected on Doppler. Positive
urine pregnancy test

EXAM:
OBSTETRIC <14 WK US AND TRANSVAGINAL OB US
TECHNIQUE: Both transabdominal and transvaginal ultrasound examinations were
performed for complete evaluation of the gestation as well as the
maternal uterus, adnexal regions, and pelvic cul-de-sac.
Transvaginal technique was performed to assess early pregnancy.

[Series 1: us ob < 14 weeks - us ob tv · 15 of 56 slices shown]
[im 1/56]
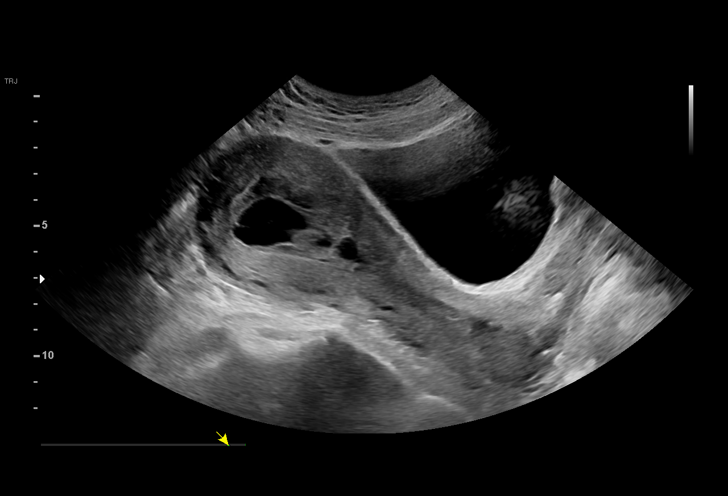
[im 5/56]
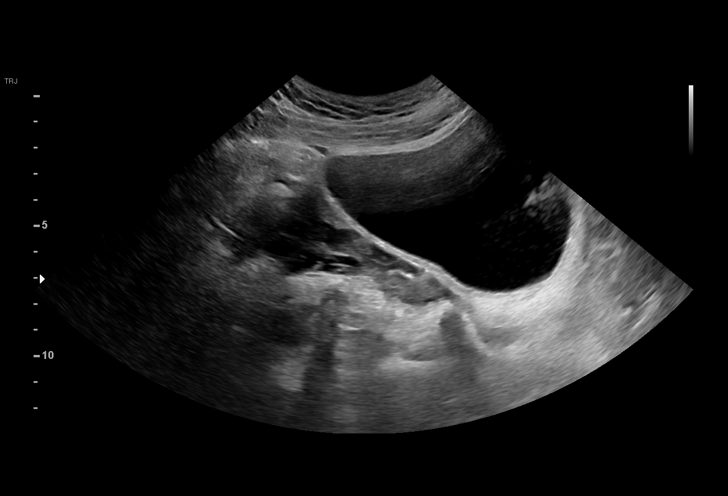
[im 9/56]
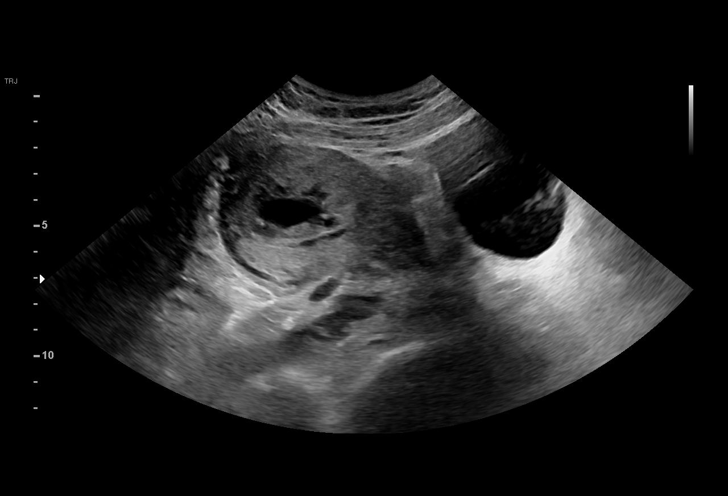
[im 13/56]
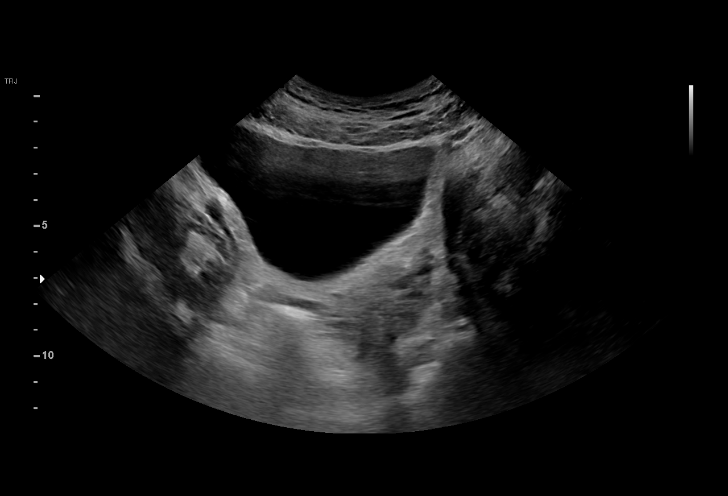
[im 17/56]
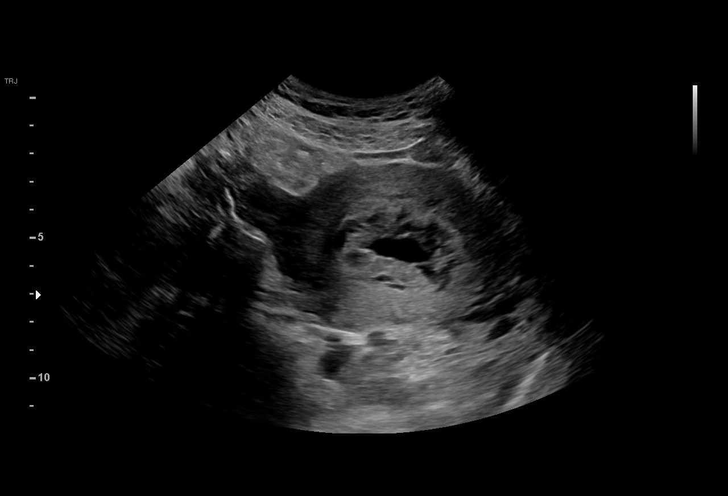
[im 21/56]
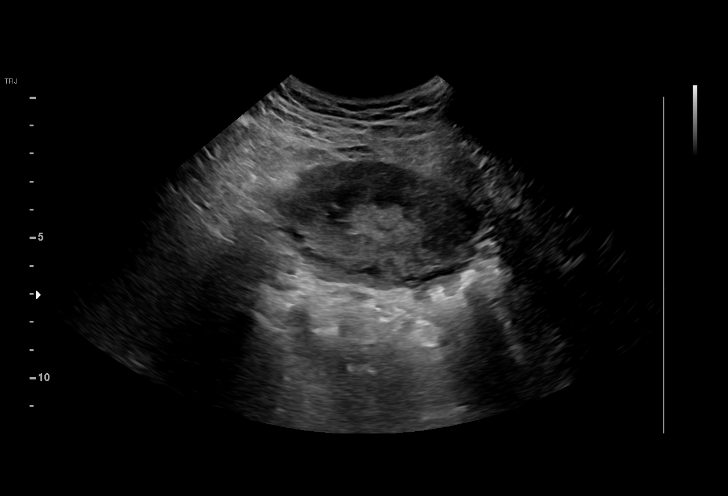
[im 25/56]
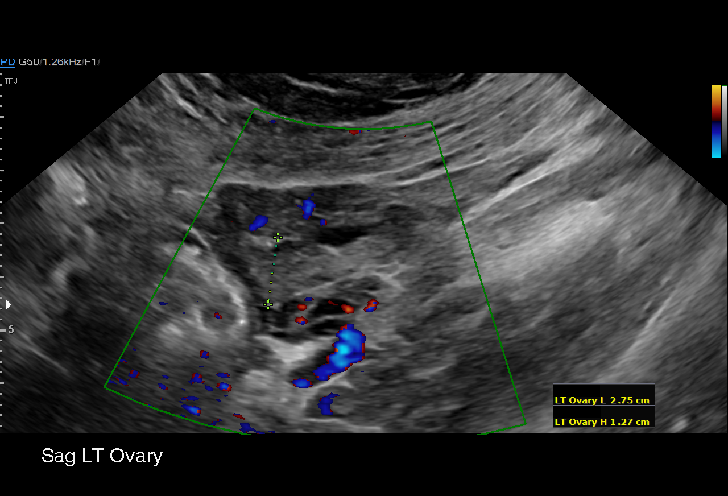
[im 29/56]
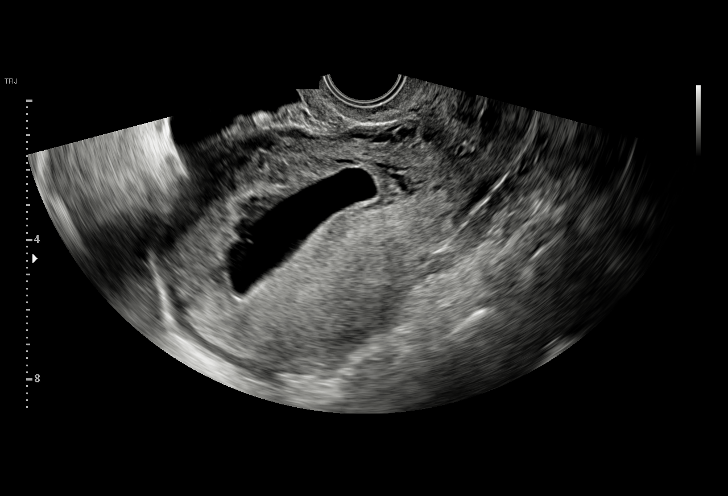
[im 31/56]
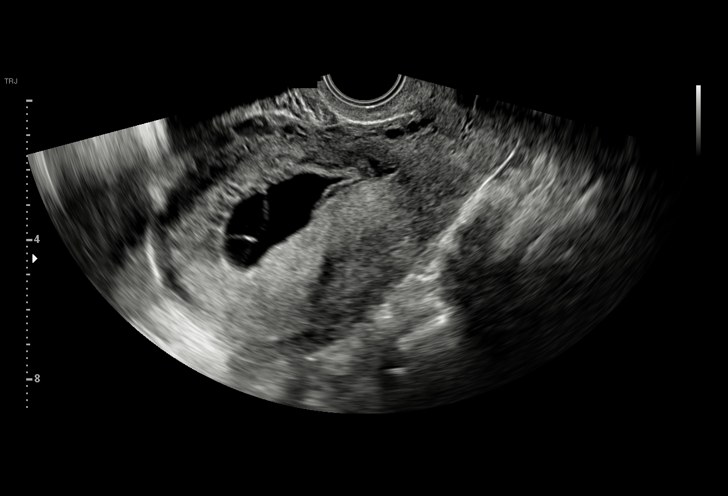
[im 35/56]
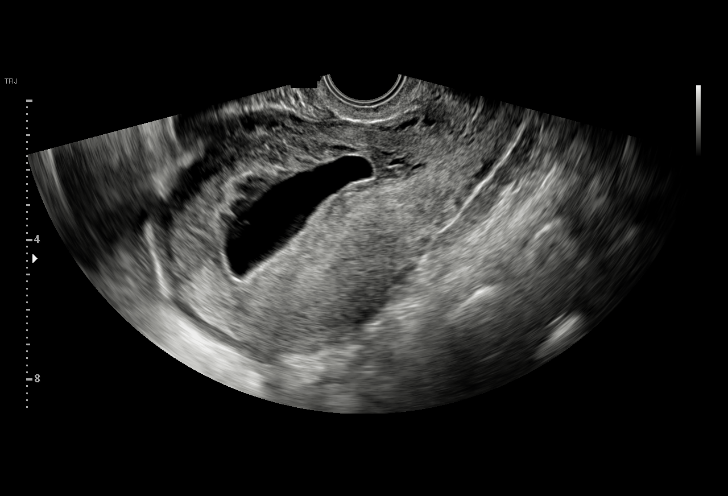
[im 39/56]
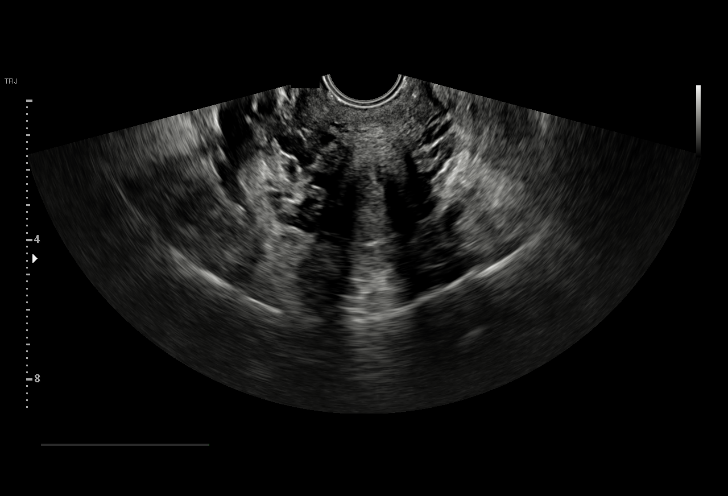
[im 43/56]
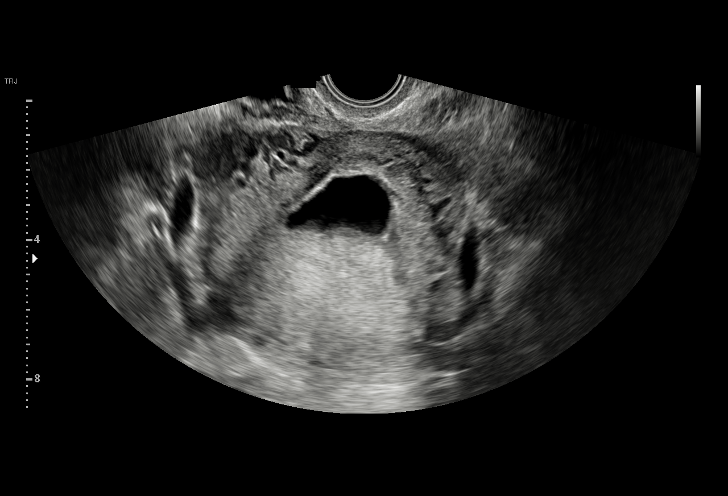
[im 47/56]
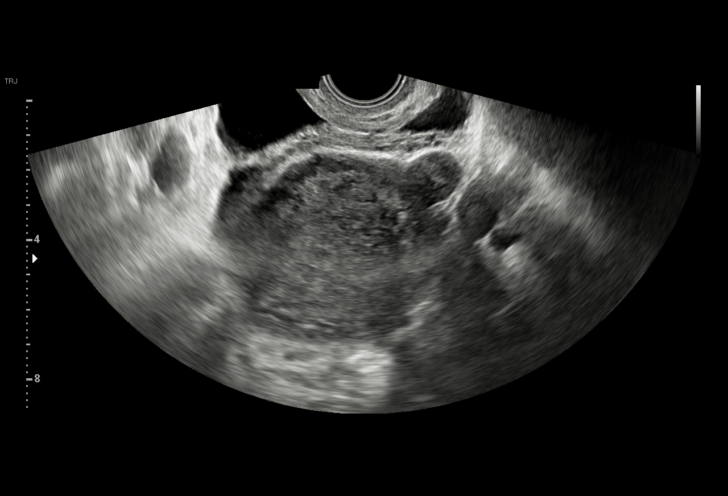
[im 51/56]
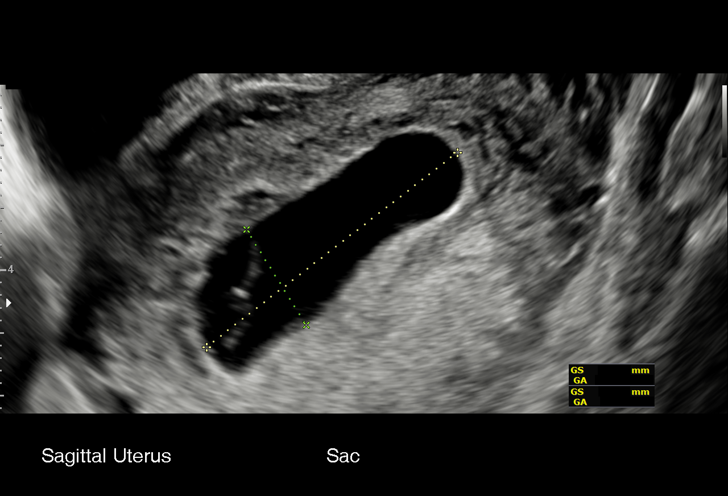
[im 56/56]
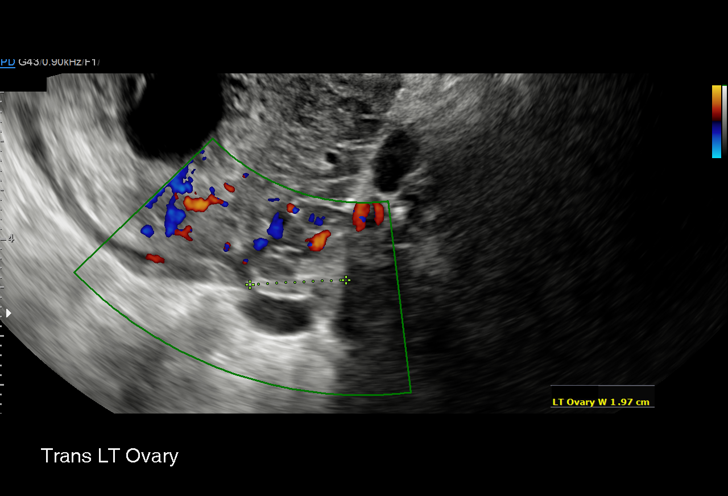

[15 of 28 positions shown; findings below may reference images not displayed]

FINDINGS: Intrauterine gestational sac: Single. Gestational sac appears
enlarged and irregular measuring 3.3 x 5.1 x 1.8 cm. There are
internal septations within the more fundal portion of the
gestational sac.

Yolk sac:  Not Visualized.

Embryo:  Not Visualized.

Cardiac Activity: Not Visualized.

MSD: 33.7 mm

Subchorionic hemorrhage:  None visualized.

Maternal uterus/adnexae: Probable right ovarian corpus luteal cyst.
Ovaries are otherwise unremarkable in appearance. No free fluid is
seen within the pelvis.
IMPRESSION: Irregular appearing intrauterine gestational sac with a mean sac
diameter of 34 mm and no visible embryo. Findings meet definitive
criteria for failed pregnancy. This follows SRU consensus
guidelines: Diagnostic Criteria for Nonviable Pregnancy Early in the
First Trimester. N Engl J Med 2723;[DATE].

These results will be called to the ordering clinician or
representative by the Radiologist Assistant, and communication
documented in the PACS or zVision Dashboard.

## 2021-10-31 DIAGNOSIS — Z419 Encounter for procedure for purposes other than remedying health state, unspecified: Secondary | ICD-10-CM | POA: Diagnosis not present

## 2021-12-01 DIAGNOSIS — Z419 Encounter for procedure for purposes other than remedying health state, unspecified: Secondary | ICD-10-CM | POA: Diagnosis not present

## 2021-12-31 DIAGNOSIS — Z419 Encounter for procedure for purposes other than remedying health state, unspecified: Secondary | ICD-10-CM | POA: Diagnosis not present

## 2022-01-31 DIAGNOSIS — Z419 Encounter for procedure for purposes other than remedying health state, unspecified: Secondary | ICD-10-CM | POA: Diagnosis not present

## 2022-02-08 ENCOUNTER — Ambulatory Visit
Admission: EM | Admit: 2022-02-08 | Discharge: 2022-02-08 | Disposition: A | Discharge status: Home / Self Care | Payer: 59 | Attending: Internal Medicine | Admitting: Internal Medicine

## 2022-02-08 DIAGNOSIS — R3915 Urgency of urination: Secondary | ICD-10-CM | POA: Diagnosis present

## 2022-02-08 DIAGNOSIS — N912 Amenorrhea, unspecified: Secondary | ICD-10-CM | POA: Insufficient documentation

## 2022-02-08 DIAGNOSIS — N760 Acute vaginitis: Secondary | ICD-10-CM | POA: Insufficient documentation

## 2022-02-08 DIAGNOSIS — Z113 Encounter for screening for infections with a predominantly sexual mode of transmission: Secondary | ICD-10-CM | POA: Insufficient documentation

## 2022-02-08 LAB — POCT URINALYSIS DIP (MANUAL ENTRY)
Bilirubin, UA: NEGATIVE
Blood, UA: NEGATIVE
Glucose, UA: NEGATIVE mg/dL
Ketones, POC UA: NEGATIVE mg/dL
Nitrite, UA: NEGATIVE
Protein Ur, POC: NEGATIVE mg/dL
Spec Grav, UA: 1.015 (ref 1.010–1.025)
Urobilinogen, UA: 0.2 E.U./dL
pH, UA: 6.5 (ref 5.0–8.0)

## 2022-02-08 LAB — POCT URINE PREGNANCY: Preg Test, Ur: NEGATIVE

## 2022-02-08 MED ORDER — CEPHALEXIN 500 MG PO CAPS: 500.0000 mg | ORAL_CAPSULE | Freq: Four times a day (QID) | ORAL | 0 refills | Status: DC

## 2022-02-08 NOTE — ED Provider Notes (Signed)
[facility]    CSN: [id number] Arrival date & time: 02/08/22  1325      History   Chief Complaint Chief Complaint  Patient presents with   Urinary Urgency    Possible Pregnancy    HPI Molly Dawson is a 36 y.o. female.   Patient presents with urinary urgency and frequency that started about 2 weeks ago. Denies dysuria, vaginal discharge, abdominal pain, hematuria, abnormal vaginal bleeding, fever. Patient also states that her menstrual cycle is 14 days late. Her last menstrual cycle was 12/26/21. She states that she typically has normal menstrual cycles. Denies any exposure to STD. Patient requesting pregnancy testing.    Possible Pregnancy    Past Medical History:  Diagnosis Date   Eczema     Patient Active Problem List   Diagnosis Date Noted   Supervision of other normal pregnancy, antepartum 06/09/2019   Vulvar inflammation 01/04/2017    History reviewed. No pertinent surgical history.  OB History     Gravida  5   Para  1   Term  1   Preterm      AB  4   Living  1      SAB  2   IAB  2   Ectopic      Multiple      Live Births  1            Home Medications    Prior to Admission medications   Medication Sig Start Date End Date Taking? Authorizing Provider  cephALEXin (KEFLEX) 500 MG capsule Take 1 capsule (500 mg total) by mouth 4 (four) times daily. 02/08/22  Yes Azarius Lambson, Acie Fredrickson, FNP  cetirizine (ZYRTEC) 10 MG tablet Take 10 mg by mouth daily. Patient not taking: Reported on 07/18/2021    [provider]  ibuprofen (ADVIL) 600 MG tablet Take 1 tablet (600 mg total) by mouth every 6 (six) hours as needed for cramping. Patient not taking: Reported on 07/18/2021 07/10/19   Donette Larry, CNM  oxyCODONE-acetaminophen (PERCOCET/ROXICET) 5-325 MG tablet Take 1 tablet by mouth every 4 (four) hours as needed for severe pain. Patient not taking: Reported on 07/22/2019 07/10/19   Donette Larry, CNM  Prenat w/o  A-FE-Methfol-FA-DHA (PRENATE DHA) 28-0.6-0.4-300 MG CAPS Take 1 tablet by mouth daily. Patient not taking: Reported on 07/18/2021 06/09/19   Conan Bowens, MD  Prenat-FeAsp-Meth-FA-DHA w/o A (PRENATE DHA) 18-0.6-0.4-300 MG CAPS Take 1 capsule by mouth daily before breakfast. Patient not taking: Reported on 07/18/2021 09/15/20   Brock Bad, MD  promethazine (PHENERGAN) 25 MG tablet Take 1 tablet (25 mg total) by mouth every 6 (six) hours as needed for nausea or vomiting. Patient not taking: Reported on 07/18/2021 07/10/19   Donette Larry, CNM    Family History Family History  Problem Relation Age of Onset   Allergic rhinitis Mother    Allergic rhinitis Father    Eczema Sister    Eczema Brother     Social History Social History   Tobacco Use   Smoking status: Never   Smokeless tobacco: Never  Vaping Use   Vaping Use: Never used  Substance Use Topics   Alcohol use: Yes    Alcohol/week: 1.0 standard drink of alcohol    Types: 1 Glasses of wine per week    Comment: occ before pregnancy   Drug use: No     Allergies   Patient has no known allergies.   Review of Systems Review of Systems Per HPI  Physical Exam Triage Vital Signs ED Triage Vitals  Enc Vitals Group     BP 02/08/22 1357 116/68     Pulse Rate 02/08/22 1357 78     Resp 02/08/22 1357 18     Temp 02/08/22 1357 98.2 F (36.8 C)     Temp Source 02/08/22 1357 Oral     SpO2 02/08/22 1357 97 %     Weight --      Height --      Head Circumference --      Peak Flow --      Pain Score 02/08/22 1400 1     Pain Loc --      Pain Edu? --      Excl. in GC? --    No data found.  Updated Vital Signs BP 116/68 (BP Location: Left Arm)   Pulse 78   Temp 98.2 F (36.8 C) (Oral)   Resp 18   LMP 12/26/2021   SpO2 97%   Visual Acuity Right Eye Distance:   Left Eye Distance:   Bilateral Distance:    Right Eye Near:   Left Eye Near:    Bilateral Near:     Physical Exam Constitutional:      General:  She is not in acute distress.    Appearance: Normal appearance. She is not toxic-appearing or diaphoretic.  HENT:     Head: Normocephalic and atraumatic.  Eyes:     Extraocular Movements: Extraocular movements intact.     Conjunctiva/sclera: Conjunctivae normal.  Cardiovascular:     Rate and Rhythm: Normal rate and regular rhythm.     Pulses: Normal pulses.     Heart sounds: Normal heart sounds.  Pulmonary:     Effort: Pulmonary effort is normal. No respiratory distress.     Breath sounds: Normal breath sounds.  Abdominal:     General: Bowel sounds are normal. There is no distension.     Palpations: Abdomen is soft.     Tenderness: There is no abdominal tenderness.  Genitourinary:    Comments: Deferred with shared decision making. Self swab performed.  Neurological:     General: No focal deficit present.     Mental Status: She is alert and oriented to person, place, and time. Mental status is at baseline.  Psychiatric:        Mood and Affect: Mood normal.        Behavior: Behavior normal.        Thought Content: Thought content normal.        Judgment: Judgment normal.      UC Treatments / Results  Labs (all labs ordered are listed, but only abnormal results are displayed) Labs Reviewed  POCT URINALYSIS DIP (MANUAL ENTRY) - Abnormal; Notable for the following components:      Result Value   Leukocytes, UA Trace (*)    All other components within normal limits  URINE CULTURE  BETA HCG QUANT (REF LAB)  POCT URINE PREGNANCY  CERVICOVAGINAL ANCILLARY ONLY    EKG   Radiology No results found.  Procedures Procedures (including critical care time)  Medications Ordered in UC Medications - No data to display  Initial Impression / Assessment and Plan / UC Course  I have reviewed the triage vital signs and the nursing notes.  Pertinent labs & imaging results that were available during my care of the patient were reviewed by me and considered in my medical decision  making (see chart for details).  UA showing trace leuks which is not definitive for UTI but with associated symptoms will opt to treat for uti with antibiotics. Urine culture pending. Will send cervicovaginal swab as well to rule out vaginitis as cause of symptoms and amenorrhea. Urine preg test negative. Will obtain quant hcg. Patient advised to follow up with gynecology if symptoms persist or worsen especially amenorrhea. Patient verbalized understanding and was agreeable with plan.  Final Clinical Impressions(s) / UC Diagnoses   Final diagnoses:  Urinary urgency  Amenorrhea  Screening examination for venereal disease     Discharge Instructions      You are being treated for suspected urinary tract infection with an antibiotic.  Urine culture, vaginal swab, blood work for pregnancy are all pending.  Your urine pregnancy test was negative today.  We will call if there are any abnormal results.  Recommend that you follow-up with your gynecologist if symptoms persist or worsen.    ED Prescriptions     Medication Sig Dispense Auth. Provider   cephALEXin (KEFLEX) 500 MG capsule Take 1 capsule (500 mg total) by mouth 4 (four) times daily. 28 capsule [place], Acie Fredrickson, [place]      PDMP not reviewed this encounter.   Gustavus Bryant, [place] 02/08/22 [phone]

## 2022-02-08 NOTE — Discharge Instructions (Signed)
You are being treated for suspected urinary tract infection with an antibiotic.  Urine culture, vaginal swab, blood work for pregnancy are all pending.  Your urine pregnancy test was negative today.  We will call if there are any abnormal results.  Recommend that you follow-up with your gynecologist if symptoms persist or worsen.

## 2022-02-08 NOTE — ED Triage Notes (Signed)
Pt presents with urinary urgency for over 2 weeks and possible pregnancy.

## 2022-02-09 LAB — CERVICOVAGINAL ANCILLARY ONLY
Bacterial Vaginitis (gardnerella): POSITIVE — AB
Candida Glabrata: NEGATIVE
Candida Vaginitis: NEGATIVE
Chlamydia: NEGATIVE
Comment: NEGATIVE
Comment: NEGATIVE
Comment: NEGATIVE
Comment: NEGATIVE
Comment: NEGATIVE
Comment: NORMAL
Neisseria Gonorrhea: NEGATIVE
Trichomonas: NEGATIVE

## 2022-02-09 LAB — URINE CULTURE

## 2022-02-09 LAB — BETA HCG QUANT (REF LAB): hCG Quant: <1 m[IU]/mL

## 2022-02-10 ENCOUNTER — Telehealth: Payer: Self-pay | Admitting: Internal Medicine

## 2022-02-10 ENCOUNTER — Telehealth (HOSPITAL_COMMUNITY): Payer: Self-pay | Admitting: Emergency Medicine

## 2022-02-10 MED ORDER — METRONIDAZOLE 500 MG PO TABS: 500.0000 mg | ORAL_TABLET | Freq: Two times a day (BID) | ORAL | 0 refills | Status: DC

## 2022-02-10 NOTE — Telephone Encounter (Signed)
Cervicovaginal swab positive for BV.  Attempted to call patient to notify her but no answer.  Unable to leave voicemail.  Will send metronidazole to pharmacy on file.  Patient may  discontinue cephalexin given urine culture results as well.

## 2022-03-03 DIAGNOSIS — Z419 Encounter for procedure for purposes other than remedying health state, unspecified: Secondary | ICD-10-CM | POA: Diagnosis not present

## 2022-03-08 ENCOUNTER — Telehealth: Payer: Self-pay | Admitting: *Deleted

## 2022-03-08 NOTE — Telephone Encounter (Signed)
Returned TC from pt concerning missed period. Negative bHCG in ED on 02/08/22. Negative home UPT 03/06/22. Reports still has not had period. Advised nurse visit for office UPT, then provider visit if negative and still concerned with not having period. Call transferred to schedulers.

## 2022-03-14 ENCOUNTER — Ambulatory Visit (INDEPENDENT_AMBULATORY_CARE_PROVIDER_SITE_OTHER): Payer: Medicaid Other | Admitting: *Deleted

## 2022-03-14 VITALS — BP 101/76 | HR 82 | Wt 203.9 lb

## 2022-03-14 DIAGNOSIS — Z3202 Encounter for pregnancy test, result negative: Secondary | ICD-10-CM | POA: Diagnosis not present

## 2022-03-14 DIAGNOSIS — R300 Dysuria: Secondary | ICD-10-CM | POA: Diagnosis not present

## 2022-03-14 LAB — POCT URINALYSIS DIPSTICK
Bilirubin, UA: NEGATIVE
Glucose, UA: NEGATIVE
Ketones, UA: POSITIVE
Leukocytes, UA: NEGATIVE
Nitrite, UA: NEGATIVE
Protein, UA: POSITIVE — AB
Spec Grav, UA: 1.02 (ref 1.010–1.025)
Urobilinogen, UA: 0.2 E.U./dL
pH, UA: 5.5 (ref 5.0–8.0)

## 2022-03-14 LAB — POCT URINE PREGNANCY: Preg Test, Ur: NEGATIVE

## 2022-03-14 NOTE — Progress Notes (Signed)
Molly Dawson presents today for UPT. She has no unusual complaints. LMP: 03/14/22    OBJECTIVE: Appears well, in no apparent distress.  OB History     Gravida  5   Para  1   Term  1   Preterm      AB  4   Living  1      SAB  2   IAB  2   Ectopic      Multiple      Live Births  1          Home UPT Result: negative In-Office UPT result:negative I have reviewed the patient's medical, obstetrical, social, and family histories, and medications.   ASSESSMENT: Negative pregnancy test  PLAN Pt to f/u for problem visit on 03/21/22 for missed cycle w/ negative UPT and "not feeling right."   SUBJECTIVE: Molly Dawson is a 36 y.o. female who complains of urinary frequency, urgency and dysuria x 2 days, without flank pain, fever, chills, or abnormal vaginal discharge or bleeding.   OBJECTIVE: Appears well, in no apparent distress.  Vital signs are normal. Urine dipstick shows positive for RBC's, positive for protein, and positive for ketones (menstrual blood present).    ASSESSMENT: Dysuria  PLAN: Treatment per orders.  Call or return to clinic prn if these symptoms worsen or fail to improve as anticipated.  Urine culture ordered and collected.

## 2022-03-16 LAB — URINE CULTURE

## 2022-03-21 ENCOUNTER — Encounter: Payer: Self-pay | Admitting: Family Medicine

## 2022-03-21 ENCOUNTER — Ambulatory Visit (INDEPENDENT_AMBULATORY_CARE_PROVIDER_SITE_OTHER): Payer: Managed Care, Other (non HMO) | Admitting: Family Medicine

## 2022-03-21 VITALS — BP 109/74 | HR 55 | Wt 195.3 lb

## 2022-03-21 DIAGNOSIS — Z3169 Encounter for other general counseling and advice on procreation: Secondary | ICD-10-CM

## 2022-03-21 DIAGNOSIS — N914 Secondary oligomenorrhea: Secondary | ICD-10-CM | POA: Diagnosis not present

## 2022-03-21 DIAGNOSIS — N915 Oligomenorrhea, unspecified: Secondary | ICD-10-CM | POA: Insufficient documentation

## 2022-03-21 NOTE — Progress Notes (Signed)
    Subjective:    Patient ID: Molly Dawson is a 36 y.o. female presenting with No chief complaint on file.  on 03/21/2022  HPI: Had missed her last cycle. Had gone 47 days without a cycle. Now had it since 9/12. Previous cycles have been like clock work and regular. Has had increased stress and some increased eating. Works 12-13 hours 5 days/week. Feels some depression and anxiety and she is not sleeping well.  Review of Systems  Constitutional:  Negative for chills and fever.  Respiratory:  Negative for shortness of breath.   Cardiovascular:  Negative for chest pain.  Gastrointestinal:  Negative for abdominal pain, nausea and vomiting.  Genitourinary:  Negative for dysuria.  Skin:  Negative for rash.      Objective:    BP 109/74   Pulse (!) 55   Wt 195 lb 4.8 oz (88.6 kg)   LMP 03/14/2022 (Exact Date)   BMI 34.60 kg/m  Physical Exam Exam conducted with a chaperone present.  Constitutional:      General: She is not in acute distress.    Appearance: She is well-developed.  HENT:     Head: Normocephalic and atraumatic.  Eyes:     General: No scleral icterus. Cardiovascular:     Rate and Rhythm: Normal rate.  Pulmonary:     Effort: Pulmonary effort is normal.  Abdominal:     Palpations: Abdomen is soft.  Musculoskeletal:     Cervical back: Neck supple.  Skin:    General: Skin is warm and dry.  Neurological:     Mental Status: She is alert and oriented to person, place, and time.         Assessment & Plan:   Problem List Items Addressed This Visit       Unprioritized   Oligomenorrhea    Likely related to weight gain and stress. Did not want meds at this point. Mindfulness, meditation, healthy snacking, regular exercise emphasized.       Other Visit Diagnoses     Encounter for preconception consultation    -  Primary   discussed age and beginning PNVs        Return in about 6 weeks (around 05/02/2022), or if symptoms worsen or fail to improve,  for a follow-up.  Donnamae Jude, MD 03/21/2022 10:01 AM

## 2022-03-21 NOTE — Progress Notes (Signed)
Pt presents for missed cycle follow-up. LMP was 03/14/22. Would like to discuss pregnancy with provider (pt wants to become pregnant within the next year).

## 2022-03-21 NOTE — Assessment & Plan Note (Signed)
Likely related to weight gain and stress. Did not want meds at this point. Mindfulness, meditation, healthy snacking, regular exercise emphasized.

## 2022-04-02 DIAGNOSIS — Z419 Encounter for procedure for purposes other than remedying health state, unspecified: Secondary | ICD-10-CM | POA: Diagnosis not present

## 2022-05-02 ENCOUNTER — Ambulatory Visit: Payer: Medicaid Other | Admitting: Obstetrics and Gynecology

## 2022-05-03 DIAGNOSIS — Z419 Encounter for procedure for purposes other than remedying health state, unspecified: Secondary | ICD-10-CM | POA: Diagnosis not present

## 2022-05-22 ENCOUNTER — Encounter: Payer: Self-pay | Admitting: Emergency Medicine

## 2022-05-22 ENCOUNTER — Telehealth: Payer: Self-pay | Admitting: Emergency Medicine

## 2022-05-22 NOTE — Telephone Encounter (Signed)
Incoming call from pt. Pt has+ positive pregnancy test. Has questions about safe medications. Safe meds list sent via MyChart.

## 2022-05-23 ENCOUNTER — Ambulatory Visit (INDEPENDENT_AMBULATORY_CARE_PROVIDER_SITE_OTHER): Payer: 59

## 2022-05-23 VITALS — Ht 63.0 in | Wt 205.0 lb

## 2022-05-23 DIAGNOSIS — Z3201 Encounter for pregnancy test, result positive: Secondary | ICD-10-CM

## 2022-05-23 DIAGNOSIS — N912 Amenorrhea, unspecified: Secondary | ICD-10-CM

## 2022-05-23 LAB — POCT URINE PREGNANCY: Preg Test, Ur: POSITIVE — AB

## 2022-05-23 MED ORDER — PRENATE DHA 18-0.6-0.4-300 MG PO CAPS: 1.0000 | ORAL_CAPSULE | Freq: Every day | ORAL | 11 refills | Status: DC

## 2022-05-23 NOTE — Progress Notes (Signed)
Molly Dawson presents today for UPT. She has no unusual complaints. LMP: 04/14/22 [redacted]w[redacted]d EDD 01/19/23    OBJECTIVE: Appears well, in no apparent distress.  OB History     Gravida  5   Para  1   Term  1   Preterm      AB  4   Living  1      SAB  2   IAB  2   Ectopic      Multiple      Live Births  1          Home UPT Result: Positive In-Office UPT result: Positive I have reviewed the patient's medical, obstetrical, social, and family histories, and medications.   ASSESSMENT: Positive pregnancy test  PLAN Prenatal care to be completed at: [facility] Schedule New OB Intake and New OB Provider

## 2022-05-29 ENCOUNTER — Telehealth: Payer: Self-pay | Admitting: Emergency Medicine

## 2022-05-29 NOTE — Telephone Encounter (Signed)
Incoming call from pt reporting menstrual-like cramping, pink spotting for the last few days. Pt given precautions to present to MAU.

## 2022-05-31 ENCOUNTER — Other Ambulatory Visit: Payer: Self-pay | Admitting: *Deleted

## 2022-05-31 DIAGNOSIS — Z3481 Encounter for supervision of other normal pregnancy, first trimester: Secondary | ICD-10-CM

## 2022-05-31 MED ORDER — PRENATAL 27-1 MG PO TABS: 1.0000 | ORAL_TABLET | Freq: Every day | ORAL | 12 refills | Status: AC

## 2022-05-31 NOTE — Progress Notes (Signed)
TC to pt to follow up on recent report of cramping and spotting. No further spotting noted. Occ cramping, not sustained, not one sided. Advised to seek care for increase in VB, pain, or for one sided abd pain.   Rec'd PA PNV. Discussed option to sent generic. Pt agrees. Taking OTC for now.

## 2022-06-02 DIAGNOSIS — Z419 Encounter for procedure for purposes other than remedying health state, unspecified: Secondary | ICD-10-CM | POA: Diagnosis not present

## 2022-06-05 ENCOUNTER — Telehealth: Payer: Self-pay | Admitting: *Deleted

## 2022-06-05 NOTE — Telephone Encounter (Signed)
Returned TC. Pt reports continued cramping in early pregnancy. Denies VB. This has been on going since "a few days" prior to 05/29/22.  Pt advised to seek care in MAU to r/o ectopic, SAB, [facility]. Pt verbalized understanding. Instructions on MAU location given.

## 2022-06-06 ENCOUNTER — Inpatient Hospital Stay (HOSPITAL_COMMUNITY)
Admission: AD | Admit: 2022-06-06 | Discharge: 2022-06-06 | Disposition: A | Discharge status: Home / Self Care | Payer: 59 | Attending: Obstetrics & Gynecology | Admitting: Obstetrics & Gynecology

## 2022-06-06 ENCOUNTER — Inpatient Hospital Stay (HOSPITAL_COMMUNITY): Payer: 59

## 2022-06-06 ENCOUNTER — Encounter (HOSPITAL_COMMUNITY): Payer: Self-pay

## 2022-06-06 DIAGNOSIS — Z3A01 Less than 8 weeks gestation of pregnancy: Secondary | ICD-10-CM | POA: Diagnosis not present

## 2022-06-06 DIAGNOSIS — R109 Unspecified abdominal pain: Secondary | ICD-10-CM | POA: Diagnosis not present

## 2022-06-06 DIAGNOSIS — O26891 Other specified pregnancy related conditions, first trimester: Secondary | ICD-10-CM | POA: Insufficient documentation

## 2022-06-06 DIAGNOSIS — R102 Pelvic and perineal pain: Secondary | ICD-10-CM | POA: Insufficient documentation

## 2022-06-06 DIAGNOSIS — Z3490 Encounter for supervision of normal pregnancy, unspecified, unspecified trimester: Secondary | ICD-10-CM

## 2022-06-06 DIAGNOSIS — O09521 Supervision of elderly multigravida, first trimester: Secondary | ICD-10-CM | POA: Insufficient documentation

## 2022-06-06 DIAGNOSIS — N96 Recurrent pregnancy loss: Secondary | ICD-10-CM | POA: Insufficient documentation

## 2022-06-06 DIAGNOSIS — O26899 Other specified pregnancy related conditions, unspecified trimester: Secondary | ICD-10-CM | POA: Diagnosis not present

## 2022-06-06 LAB — URINALYSIS, ROUTINE W REFLEX MICROSCOPIC
Bilirubin Urine: NEGATIVE
Glucose, UA: NEGATIVE mg/dL
Hgb urine dipstick: NEGATIVE
Ketones, ur: NEGATIVE mg/dL
Nitrite: NEGATIVE
Protein, ur: NEGATIVE mg/dL
Specific Gravity, Urine: 1.012 (ref 1.005–1.030)
pH: 6 (ref 5.0–8.0)

## 2022-06-06 LAB — WET PREP, GENITAL
Sperm: NONE SEEN
Trich, Wet Prep: NONE SEEN
WBC, Wet Prep HPF POC: <10 (ref <10)
Yeast Wet Prep HPF POC: NONE SEEN

## 2022-06-06 LAB — CBC
HCT: 31.7 % — ABNORMAL LOW (ref 36.0–46.0)
Hemoglobin: 11.2 g/dL — ABNORMAL LOW (ref 12.0–15.0)
MCH: 25.7 pg — ABNORMAL LOW (ref 26.0–34.0)
MCHC: 35.3 g/dL (ref 30.0–36.0)
MCV: 72.9 fL — ABNORMAL LOW (ref 80.0–100.0)
Platelets: 257 10*3/uL (ref 150–400)
RBC: 4.35 MIL/uL (ref 3.87–5.11)
RDW: 15.1 % (ref 11.5–15.5)
WBC: 9.9 10*3/uL (ref 4.0–10.5)
nRBC: 0 % (ref 0.0–0.2)

## 2022-06-06 LAB — HIV ANTIBODY (ROUTINE TESTING W REFLEX): HIV Screen 4th Generation wRfx: NONREACTIVE

## 2022-06-06 LAB — HCG, QUANTITATIVE, PREGNANCY: hCG, Beta Chain, Quant, S: 11351 m[IU]/mL — ABNORMAL HIGH (ref <5)

## 2022-06-06 MED ORDER — CEFADROXIL 500 MG PO CAPS: 500.0000 mg | ORAL_CAPSULE | Freq: Two times a day (BID) | ORAL | 0 refills | Status: AC

## 2022-06-06 NOTE — MAU Provider Note (Signed)
History     CSN: [id number]  Arrival date and time: 06/06/22 5809   Event Date/Time   First Provider Initiated Contact with Patient 06/06/22 [phone]      Chief Complaint  Patient presents with   Pelvic Pain   Vaginal Itching   Ms. Deby Adger is a 36 y.o. year old [id number] female at [redacted]w[redacted]d weeks gestation who presents to MAU reporting intermittent pelvic cramping/pain x 2 weeks and a vaginal odor since yesterday. She last felt the pelvic pain about 0715 this AM that lasted 30-45 seconds. She also reports "light" spotting 2 weeks that lasted 1-2 days, but no VB now. She had SI on Saturday 06/03/2022 and now is having a "mild" vaginal odor with occasional itching. She expresses being "very anxious" since she had an incomplete SAB in 2021. She plans to receive [facility] with [facility]; next appt is 06/14/2022 and 07/12/2022.    OB History     Gravida  5   Para  1   Term  1   Preterm      AB  3   Living  1      SAB  1   IAB  2   Ectopic      Multiple      Live Births  1           Past Medical History:  Diagnosis Date   Eczema     History reviewed. No pertinent surgical history.  Family History  Problem Relation Age of Onset   Allergic rhinitis Mother    Allergic rhinitis Father    Eczema Sister    Eczema Brother     Social History   Tobacco Use   Smoking status: Never   Smokeless tobacco: Never  Vaping Use   Vaping Use: Never used  Substance Use Topics   Alcohol use: Yes    Alcohol/week: 1.0 standard drink of alcohol    Types: 1 Glasses of wine per week    Comment: occ before pregnancy   Drug use: No    Allergies: No Known Allergies  Medications Prior to Admission  Medication Sig Dispense Refill Last Dose   cephALEXin (KEFLEX) 500 MG capsule Take 1 capsule (500 mg total) by mouth 4 (four) times daily. 28 capsule 0    cetirizine (ZYRTEC) 10 MG tablet Take 10 mg by mouth daily.      ibuprofen (ADVIL) 600 MG tablet Take 1 tablet (600 mg total) by mouth  every 6 (six) hours as needed for cramping. (Patient not taking: Reported on 07/18/2021) 30 tablet 0    metroNIDAZOLE (FLAGYL) 500 MG tablet Take 1 tablet (500 mg total) by mouth 2 (two) times daily. 14 tablet 0    oxyCODONE-acetaminophen (PERCOCET/ROXICET) 5-325 MG tablet Take 1 tablet by mouth every 4 (four) hours as needed for severe pain. 6 tablet 0    Prenat w/o A-FE-Methfol-FA-DHA (PRENATE DHA) 28-0.6-0.4-300 MG CAPS Take 1 tablet by mouth daily. 30 capsule 12    Prenat-FeAsp-Meth-FA-DHA w/o A (PRENATE DHA) 18-0.6-0.4-300 MG CAPS Take 1 capsule by mouth daily before breakfast. 30 capsule 11    Prenat-FeAsp-Meth-FA-DHA w/o A (PRENATE DHA) 18-0.6-0.4-300 MG CAPS Take 1 capsule by mouth daily. 30 capsule 11    Prenatal 27-1 MG TABS Take 1 tablet by mouth daily. 30 tablet 12    promethazine (PHENERGAN) 25 MG tablet Take 1 tablet (25 mg total) by mouth every 6 (six) hours as needed for nausea or vomiting. 30 tablet 0  Review of Systems  Constitutional: Negative.   HENT: Negative.    Eyes: Negative.   Respiratory: Negative.    Cardiovascular: Negative.   Gastrointestinal: Negative.   Endocrine: Negative.   Genitourinary:  Positive for pelvic pain (intermittent and mild x 2 wks). Negative for vaginal bleeding (x 1-2 days about 2 wks ago).  Musculoskeletal: Negative.   Skin: Negative.   Allergic/Immunologic: Negative.   Neurological: Negative.   Hematological: Negative.   Psychiatric/Behavioral:  The patient is nervous/anxious.    Physical Exam   Blood pressure 110/68, pulse 77, temperature 98.3 F (36.8 C), temperature source Oral, resp. rate 15, height 5\' 3"  (1.6 m), weight 95.4 kg, last menstrual period 04/14/2022, SpO2 97 %.  Physical Exam Vitals and nursing note reviewed.  Constitutional:      Appearance: Normal appearance. She is obese.  Cardiovascular:     Rate and Rhythm: Normal rate.  Pulmonary:     Effort: Pulmonary effort is normal.  Genitourinary:    Comments:  Vaginal swabs obtained using blind swab technique by patient Musculoskeletal:        General: Normal range of motion.  Skin:    General: Skin is warm and dry.  Neurological:     Mental Status: She is alert and oriented to person, place, and time.  Psychiatric:        Mood and Affect: Mood normal.        Behavior: Behavior normal.        Thought Content: Thought content normal.        Judgment: Judgment normal.    MAU Course  Procedures  MDM CCUA UCx -- Results pending  UPT CBC ABO/Rh -- not drawn; known O POS HCG Wet Prep GC/CT -- pending HIV -- pending OB < 14 wks 04/16/2022 with TV  Results for orders placed or performed during the hospital encounter of 06/06/22 (from the past 24 hour(s))  CBC     Status: Abnormal   Collection Time: 06/06/22  9:31 AM  Result Value Ref Range   WBC 9.9 4.0 - 10.5 K/uL   RBC 4.35 3.87 - 5.11 MIL/uL   Hemoglobin 11.2 (L) 12.0 - 15.0 g/dL   HCT 14/05/23 (L) 81.1 - 91.4 %   MCV 72.9 (L) 80.0 - 100.0 fL   MCH 25.7 (L) 26.0 - 34.0 pg   MCHC 35.3 30.0 - 36.0 g/dL   RDW 78.2 95.6 - 21.3 %   Platelets 257 150 - 400 K/uL   nRBC 0.0 0.0 - 0.2 %  hCG, quantitative, pregnancy     Status: Abnormal   Collection Time: 06/06/22  9:31 AM  Result Value Ref Range   hCG, Beta Chain, Quant, S 11,351 (H) <5 mIU/mL  Urinalysis, Routine w reflex microscopic PATH Cytology Cervicovaginal Ancillary Only     Status: Abnormal   Collection Time: 06/06/22  9:32 AM  Result Value Ref Range   Color, Urine YELLOW YELLOW   APPearance HAZY (A) CLEAR   Specific Gravity, Urine 1.012 1.005 - 1.030   pH 6.0 5.0 - 8.0   Glucose, UA NEGATIVE NEGATIVE mg/dL   Hgb urine dipstick NEGATIVE NEGATIVE   Bilirubin Urine NEGATIVE NEGATIVE   Ketones, ur NEGATIVE NEGATIVE mg/dL   Protein, ur NEGATIVE NEGATIVE mg/dL   Nitrite NEGATIVE NEGATIVE   Leukocytes,Ua MODERATE (A) NEGATIVE   RBC / HPF 0-5 0 - 5 RBC/hpf   WBC, UA 21-50 0 - 5 WBC/hpf   Bacteria, UA FEW (A) NONE SEEN   Squamous  Epithelial / LPF 11-20 0 - 5   Mucus PRESENT   Wet prep, genital     Status: Abnormal   Collection Time: 06/06/22  9:32 AM   Specimen: PATH Cytology Cervicovaginal Ancillary Only  Result Value Ref Range   Yeast Wet Prep HPF POC NONE SEEN NONE SEEN   Trich, Wet Prep NONE SEEN NONE SEEN   Clue Cells Wet Prep HPF POC PRESENT (A) NONE SEEN   WBC, Wet Prep HPF POC <10 <10   Sperm NONE SEEN     US OB LESS THAN 14 WEEKS WITH OB TRANSVAGINAL  Result Date: 06/06/2022 CLINICAL DATA:  Cramping for 2 weeks. Abdominal pain during pregnancy in first trimester. No vaginal bleeding. Last menstrual period 04/14/2022, corresponding to estimated gestational age of [redacted] weeks 4 days. EXAM: OBSTETRIC <14 WK [place] AND TRANSVAGINAL OB US TECHNIQUE: Both transabdominal and transvaginal ultrasound examinations were performed for complete evaluation of the gestation as well as the maternal uterus, adnexal regions, and pelvic cul-de-sac. Transvaginal technique was performed to assess early pregnancy. COMPARISON:  No prior obstetric ultrasound comparison for this pregnancy. FINDINGS: Intrauterine gestational sac: Single Yolk sac:  Visualized. Embryo:  Visualized. Cardiac Activity: Visualized. Heart Rate: 121 bpm CRL:   5.3 mm   6 w 2 d                  [place] EDC: 01/28/2023 Subchorionic hemorrhage:  None visualized. Maternal uterus/adnexae: Within normal limits. IMPRESSION: Single live intrauterine pregnancy with crown-rump length corresponding to 6 weeks 2 days estimated gestational age. Electronically Signed   By: Neita Garnet M.D.   On: 06/06/2022 10:42     Assessment and Plan  1. Abdominal cramping affecting pregnancy - Information provided on abdominal pain in pregnancy   2. Intrauterine pregnancy - Start [facility] as scheduled  3. [redacted] weeks gestation of pregnancy   - Discharge patient - Keep scheduled appt with [facility] 06/14/2022 and 07/12/2022 - Patient verbalized an understanding of the plan of care and agrees.    Raelyn Mora, CNM 06/06/2022, 9:28 AM

## 2022-06-06 NOTE — MAU Note (Signed)
...  Molly Dawson is a 36 y.o. at [redacted]w[redacted]d here in MAU reporting: Intermittent pelvic cramping for the past two weeks. She reports the cramping comes in waves. She reports the last time she felt the cramp was around 0715 and it lasted around 30-45 seconds. Denies VB. She reports two weeks ago she experienced light vaginal spotting for 1-2 days. Denies vaginal discharge but endorses a "mild" vaginal odor with occasional vaginal itching. Last IC this past Saturday.   Patient very nervous in triage due to her previous miscarriage in 2021.  LMP: 04/14/2022 Onset of complaint: Two weeks ago Pain score: Denies current pain.  Lab orders placed from triage:  UA

## 2022-06-07 LAB — CULTURE, OB URINE

## 2022-06-07 LAB — RPR: RPR Ser Ql: NONREACTIVE

## 2022-06-07 LAB — GC/CHLAMYDIA PROBE AMP (~~LOC~~) NOT AT ARMC
Chlamydia: NEGATIVE
Comment: NEGATIVE
Comment: NORMAL
Neisseria Gonorrhea: NEGATIVE

## 2022-06-14 ENCOUNTER — Ambulatory Visit: Payer: 59 | Admitting: *Deleted

## 2022-06-14 VITALS — BP 101/68 | HR 75 | Ht 63.0 in | Wt 206.0 lb

## 2022-06-14 DIAGNOSIS — Z3480 Encounter for supervision of other normal pregnancy, unspecified trimester: Secondary | ICD-10-CM | POA: Insufficient documentation

## 2022-06-14 DIAGNOSIS — O219 Vomiting of pregnancy, unspecified: Secondary | ICD-10-CM

## 2022-06-14 MED ORDER — PROMETHAZINE HCL 25 MG PO TABS: 25.0000 mg | ORAL_TABLET | Freq: Four times a day (QID) | ORAL | 1 refills | Status: DC | PRN

## 2022-06-14 MED ORDER — BLOOD PRESSURE KIT DEVI: 1.0000 | 0 refills | Status: DC

## 2022-06-14 NOTE — Progress Notes (Signed)
New OB Intake  I connected withNAME@ on 06/14/22 at  1:10 PM EST by In Person Visit and verified that I am speaking with the correct person using two identifiers. Nurse is located at [facility] and pt is located at [place].  I discussed the limitations, risks, security and privacy concerns of performing an evaluation and management service by telephone and the availability of in person appointments. I also discussed with the patient that there may be a patient responsible charge related to this service. The patient expressed understanding and agreed to proceed.  I explained I am completing New OB Intake today. We discussed EDD of 01/28/23 that is based on LMP of 04/14/22. Pt is G5/P1. I reviewed her allergies, medications, Medical/Surgical/OB history, and appropriate screenings. I informed her of [facility] services. [facility] information placed in AVS. Based on history, this is a low risk pregnancy.  Patient Active Problem List   Diagnosis Date Noted   Oligomenorrhea 03/21/2022   Vulvar inflammation 01/04/2017    Concerns addressed today  Delivery Plans Plans to deliver at [facility]. Patient given information for [facility] website for more information [facility]. Patient is not interested in water birth. Offered upcoming OB visit with CNM to discuss further.  MyChart/Babyscripts MyChart access verified. I explained pt will have some visits in office and some virtually. Babyscripts instructions given and order placed. Patient verifies receipt of registration text/e-mail. Account successfully created and app downloaded.  Blood Pressure Cuff/Weight Scale Blood pressure cuff ordered for patient to pick-up from [employer]. Explained after first prenatal appt pt will check weekly and document in Babyscripts. Patient does not have weight scale; patient may purchase if they desire to track weight weekly in Babyscripts.  Anatomy US Explained first scheduled [place] will be around  19 weeks. Anatomy US scheduled for 19wks at MFM. Pt notified to arrive at [facility].  Labs Discussed Avelina Laine genetic screening with patient. Would like both Panorama and Horizon drawn at new OB visit. Routine prenatal labs needed.  COVID Vaccine Patient has had COVID vaccine.   Social Determinants of Health Food Insecurity: Patient denies food insecurity. WIC Referral: Patient is interested in referral to [facility].  Transportation: Patient denies transportation needs. Childcare: Discussed no children allowed at ultrasound appointments. Offered childcare services; patient declines childcare services at this time.  First visit review I reviewed new OB appt with patient. I explained they will have a provider visit that includes labs. Explained pt will be seen by Dr. Clearance Coots at first visit; encounter routed to appropriate provider. Explained that patient will be seen by pregnancy navigator following visit with provider.   Harrel Lemon, RN 06/14/2022  1:18 PM

## 2022-06-28 ENCOUNTER — Other Ambulatory Visit: Payer: Self-pay

## 2022-06-28 ENCOUNTER — Inpatient Hospital Stay (HOSPITAL_COMMUNITY)
Admission: AD | Admit: 2022-06-28 | Discharge: 2022-06-28 | Disposition: A | Discharge status: Home / Self Care | Payer: 59 | Attending: Obstetrics & Gynecology | Admitting: Obstetrics & Gynecology

## 2022-06-28 ENCOUNTER — Inpatient Hospital Stay (HOSPITAL_COMMUNITY): Payer: 59

## 2022-06-28 DIAGNOSIS — Z3480 Encounter for supervision of other normal pregnancy, unspecified trimester: Secondary | ICD-10-CM

## 2022-06-28 DIAGNOSIS — Z3A09 9 weeks gestation of pregnancy: Secondary | ICD-10-CM | POA: Diagnosis not present

## 2022-06-28 DIAGNOSIS — Z3687 Encounter for antenatal screening for uncertain dates: Secondary | ICD-10-CM | POA: Diagnosis not present

## 2022-06-28 DIAGNOSIS — O09521 Supervision of elderly multigravida, first trimester: Secondary | ICD-10-CM | POA: Diagnosis not present

## 2022-06-28 DIAGNOSIS — Z6790 Unspecified blood type, Rh positive: Secondary | ICD-10-CM | POA: Insufficient documentation

## 2022-06-28 DIAGNOSIS — O021 Missed abortion: Secondary | ICD-10-CM | POA: Diagnosis present

## 2022-06-28 LAB — CBC
HCT: 31.4 % — ABNORMAL LOW (ref 36.0–46.0)
Hemoglobin: 11.3 g/dL — ABNORMAL LOW (ref 12.0–15.0)
MCH: 26 pg (ref 26.0–34.0)
MCHC: 36 g/dL (ref 30.0–36.0)
MCV: 72.4 fL — ABNORMAL LOW (ref 80.0–100.0)
Platelets: 284 10*3/uL (ref 150–400)
RBC: 4.34 MIL/uL (ref 3.87–5.11)
RDW: 14 % (ref 11.5–15.5)
WBC: 11.1 10*3/uL — ABNORMAL HIGH (ref 4.0–10.5)
nRBC: 0 % (ref 0.0–0.2)

## 2022-06-28 LAB — WET PREP, GENITAL
Sperm: NONE SEEN
Trich, Wet Prep: NONE SEEN
WBC, Wet Prep HPF POC: <10 (ref <10)
Yeast Wet Prep HPF POC: NONE SEEN

## 2022-06-28 NOTE — MAU Provider Note (Signed)
History     CSN: [id number]  Arrival date and time: 06/28/22 9323   Event Date/Time   First Provider Initiated Contact with Patient 06/28/22 1009      Chief Complaint  Patient presents with   Vaginal Bleeding   HPI Molly Dawson is a 36 y.o. G5P1031 at 9w 3d by early ultrasound. She presents to MAU with chief complaint of vaginal bleeding. This is a new problem, onset today. Patient states her bleeding is dark red and "more than spotting". She is consistently seeing smears of blood when she wipes after voiding. She is not saturating pads. She is not experiencing weakness, dizziness or activity intolerance. She is remote from sexual intercourse.  Patient also reports new onset lower abdominal cramping, which she first noted while in the MAU waiting room. Pain score is 0/10 on initial triage.   Patient endorses history of miscarriage in 2021. She is s/p New OB interview at [facility] on 06/14/2022.  OB History     Gravida  5   Para  1   Term  1   Preterm      AB  3   Living  1      SAB  1   IAB  2   Ectopic      Multiple      Live Births  1           Past Medical History:  Diagnosis Date   Eczema     Past Surgical History:  Procedure Laterality Date   NO PAST SURGERIES      Family History  Problem Relation Age of Onset   Allergic rhinitis Mother    Allergic rhinitis Father    Eczema Sister    Eczema Brother     Social History   Tobacco Use   Smoking status: Never   Smokeless tobacco: Never  Vaping Use   Vaping Use: Never used  Substance Use Topics   Alcohol use: Yes    Alcohol/week: 1.0 standard drink of alcohol    Types: 1 Glasses of wine per week    Comment: occ before pregnancy   Drug use: No    Allergies: No Known Allergies  No medications prior to admission.    Review of Systems  Gastrointestinal:  Positive for abdominal pain.  Genitourinary:  Positive for vaginal bleeding.  All other systems reviewed and are  negative.  Physical Exam   Blood pressure 104/64, pulse 83, temperature 99.1 F (37.3 C), temperature source Oral, resp. rate 18, height 5\' 3"  (1.6 m), weight 95 kg, last menstrual period 04/14/2022, SpO2 100 %.  Physical Exam Vitals and nursing note reviewed. Exam conducted with a chaperone present.  Constitutional:      General: She is not in acute distress.    Appearance: Normal appearance. She is not ill-appearing.  Cardiovascular:     Rate and Rhythm: Normal rate.     Pulses: Normal pulses.  Pulmonary:     Effort: Pulmonary effort is normal.  Abdominal:     General: Abdomen is flat.     Tenderness: There is no abdominal tenderness.  Skin:    Capillary Refill: Capillary refill takes less than 2 seconds.  Neurological:     Mental Status: She is alert and oriented to person, place, and time.  Psychiatric:        Mood and Affect: Mood normal.        Behavior: Behavior normal.        Thought Content: Thought  content normal.        Judgment: Judgment normal.     MAU Course  Procedures  MDM  --S/p viability scan during MAU encounter on 06/06/2022. Baby measuring 6w 2d at that time with FHR 121 bpm  --Detailed discussion with patient and partner regarding diagnosis of missed ab and options for plan of care. Patient reports significant negative experience with Cytotec, strong preference for expectant management.  Orders Placed This Encounter  Procedures   Wet prep, genital   US OB Transvaginal   CBC   Discharge patient   Patient Vitals for the past 24 hrs:  BP Temp Temp src Pulse Resp SpO2 Height Weight  06/28/22 0840 104/64 99.1 F (37.3 C) Oral 83 18 100 % -- --  06/28/22 0835 -- -- -- -- -- -- 5\' 3"  (1.6 m) 95 kg   Results for orders placed or performed during the hospital encounter of 06/28/22 (from the past 24 hour(s))  CBC     Status: Abnormal   Collection Time: 06/28/22  8:57 AM  Result Value Ref Range   WBC 11.1 (H) 4.0 - 10.5 K/uL   RBC 4.34 3.87 - 5.11  MIL/uL   Hemoglobin 11.3 (L) 12.0 - 15.0 g/dL   HCT 06/30/22 (L) 27.0 - 78.6 %   MCV 72.4 (L) 80.0 - 100.0 fL   MCH 26.0 26.0 - 34.0 pg   MCHC 36.0 30.0 - 36.0 g/dL   RDW 75.4 49.2 - 01.0 %   Platelets 284 150 - 400 K/uL   nRBC 0.0 0.0 - 0.2 %  Wet prep, genital     Status: Abnormal   Collection Time: 06/28/22  8:58 AM   Specimen: PATH Cytology Cervicovaginal Ancillary Only  Result Value Ref Range   Yeast Wet Prep HPF POC NONE SEEN NONE SEEN   Trich, Wet Prep NONE SEEN NONE SEEN   Clue Cells Wet Prep HPF POC PRESENT (A) NONE SEEN   WBC, Wet Prep HPF POC <10 <10   Sperm NONE SEEN    06/30/22 OB Transvaginal  Result Date: 06/28/2022 CLINICAL DATA:  Bleeding in first trimester of pregnancy, vaginal bleeding today, LMP 04/14/2022 EXAM: TRANSVAGINAL OB ULTRASOUND TECHNIQUE: Transvaginal ultrasound was performed for complete evaluation of the gestation as well as the maternal uterus, adnexal regions, and pelvic cul-de-sac. COMPARISON:  06/06/2022 FINDINGS: Intrauterine gestational sac: Present, single Yolk sac:  Present Embryo:  Present Cardiac Activity: Absent Heart Rate: N/A bpm CRL:   9.0 mm   6 w 6 d                  14/10/2021 EDC: 02/15/2023 Subchorionic hemorrhage:  None visualized. Maternal uterus/adnexae: Remainder of anteverted uterus unremarkable. Nonvisualization of LEFT ovary, RIGHT ovary normal appearance. No free pelvic fluid or adnexal masses. IMPRESSION: Intrauterine gestational sac seen containing yolk sac and fetal pole. No fetal cardiac activity identified. Findings meet definitive criteria for failed pregnancy. This follows SRU consensus guidelines: Diagnostic Criteria for Nonviable Pregnancy Early in the First Trimester. 02/17/2023 J Med [phone]. Electronically Signed   By: [id number] M.D.   On: 06/28/2022 09:29    Assessment and Plan  --36 y.o. G5P1031 at [redacted]w[redacted]d confirmed missed ab --Expectant management per patient preference --Hgb 11.3 --Rh POS blood type --Discharge home in stable  condition  F/U: --Secure chat sent to [facility] for assistance coordinating miscarriage follow-up with MD --Patient able to be scheduled with [facility]. [facility] on 07/07/2022, also desires discussion of components of fertility  evaluation  Calvert Cantor, MSA, MSN, CNM

## 2022-06-28 NOTE — MAU Note (Signed)
Molly Dawson is a 36 y.o. at [redacted]w[redacted]d here in MAU reporting: dark red VB this morning with wiping.  Reports started having intermittent cramping while in waiting area.  Denies recent intercourse. LMP: NA Onset of complaint: today Pain score: 0 Vitals:   06/28/22 0840  BP: 104/64  Pulse: 83  Resp: 18  Temp: 99.1 F (37.3 C)  SpO2: 100%     FHT:NA Lab orders placed from triage:   UA

## 2022-06-29 LAB — GC/CHLAMYDIA PROBE AMP (~~LOC~~) NOT AT ARMC
Chlamydia: NEGATIVE
Comment: NEGATIVE
Comment: NORMAL
Neisseria Gonorrhea: NEGATIVE

## 2022-07-03 DIAGNOSIS — Z419 Encounter for procedure for purposes other than remedying health state, unspecified: Secondary | ICD-10-CM | POA: Diagnosis not present

## 2022-07-04 ENCOUNTER — Telehealth: Payer: Self-pay | Admitting: Emergency Medicine

## 2022-07-04 NOTE — Telephone Encounter (Signed)
Incoming call from pt who has questions about D&C, what to expect following miscarriage. Has apt scheduled with MD on 1/5 to discuss D&C consult.

## 2022-07-07 ENCOUNTER — Ambulatory Visit (INDEPENDENT_AMBULATORY_CARE_PROVIDER_SITE_OTHER): Payer: 59 | Admitting: Obstetrics and Gynecology

## 2022-07-07 VITALS — BP 115/77 | HR 88 | Wt 209.0 lb

## 2022-07-07 DIAGNOSIS — N96 Recurrent pregnancy loss: Secondary | ICD-10-CM | POA: Diagnosis not present

## 2022-07-07 DIAGNOSIS — O039 Complete or unspecified spontaneous abortion without complication: Secondary | ICD-10-CM

## 2022-07-07 NOTE — Progress Notes (Signed)
  CC: SAB follow up Subjective:    Patient ID: Molly Dawson, female    DOB: 1986/06/09, 37 y.o.   MRN: [id number]  HPI 36 yo G5P1 seen for follow up of SAB.  Pt upset regarding miscommunications in care regarding follow up and plan.  Reviewed the miscarriage with patient and spouse.  She is visibly upset regarding the miscarriage.  I explained that early miscarriages are relatively common.  She continues to have light bleeding.  Explained to patient that we could eval recurrent pregnancy loss labs, check an ultrasound to rule out retained POC and would follow bhcg until it was normal.   Review of Systems     Objective:   Physical Exam Vitals:   07/07/22 1007  BP: 115/77  Pulse: 88         Assessment & Plan:   1. SAB (spontaneous abortion) Will follow bhcg until normal, check TVUS if needed to make sure there are no retained POC Pt was able to speak with the office manager to improve communication betweenthe office and patients.  - Beta hCG quant (ref lab) - US PELVIC COMPLETE WITH TRANSVAGINAL; Future  2. Recurrent pregnancy loss Will check recurrent pregnancy loss labs and karyotype  to look for reason for the SAB.  - OB Complications Profile - Chromosome, Blood, Routine    Griffin Basil, MD Faculty Attending, [facility]

## 2022-07-07 NOTE — Progress Notes (Signed)
Pt states she has had heavy bleeding and passed what seemed to be tissue since SAB dx.  Pt states she is still bleeding - light.   Pt would like to know next steps, ?labs/ ultrasound/ need for D&C.  Pt is planning to try to conceive again - would like to discuss.

## 2022-07-12 ENCOUNTER — Encounter: Payer: Self-pay | Admitting: Obstetrics

## 2022-07-14 ENCOUNTER — Other Ambulatory Visit (HOSPITAL_COMMUNITY): Payer: 59

## 2022-07-14 ENCOUNTER — Ambulatory Visit (HOSPITAL_BASED_OUTPATIENT_CLINIC_OR_DEPARTMENT_OTHER)
Admission: RE | Admit: 2022-07-14 | Discharge: 2022-07-14 | Disposition: A | Discharge status: Home / Self Care | Payer: 59 | Source: Ambulatory Visit | Attending: Obstetrics and Gynecology | Admitting: Obstetrics and Gynecology

## 2022-07-14 DIAGNOSIS — O039 Complete or unspecified spontaneous abortion without complication: Secondary | ICD-10-CM | POA: Diagnosis not present

## 2022-07-27 ENCOUNTER — Other Ambulatory Visit: Payer: 59

## 2022-07-27 ENCOUNTER — Other Ambulatory Visit (INDEPENDENT_AMBULATORY_CARE_PROVIDER_SITE_OTHER): Payer: 59 | Admitting: Obstetrics and Gynecology

## 2022-07-27 ENCOUNTER — Encounter: Payer: Self-pay | Admitting: Obstetrics and Gynecology

## 2022-07-27 VITALS — BP 116/75 | HR 62 | Wt 210.0 lb

## 2022-07-27 DIAGNOSIS — O021 Missed abortion: Secondary | ICD-10-CM

## 2022-07-27 DIAGNOSIS — O039 Complete or unspecified spontaneous abortion without complication: Secondary | ICD-10-CM

## 2022-07-27 LAB — OB COMPLICATIONS PROFILE
APTT: 27 s
AT III Act/Nor PPP Chro: 105 %
Anticardiolipin Ab, IgA: <10 [APL'U]
Anticardiolipin Ab, IgG: <10 [GPL'U]
Anticardiolipin Ab, IgM: <10 [MPL'U]
Antiphosphatidylserine IgG: 0 {GPS'U}
Antiphosphatidylserine IgM: 1 {MPS'U}
Beta-2 Glycoprotein I, IgA: <10 SAU
Beta-2 Glycoprotein I, IgG: <10 SGU
Beta-2 Glycoprotein I, IgM: <10 SMU
DRVVT Screen Seconds: 31.7 s
Factor XIII Activity**: 151 % — ABNORMAL HIGH
Hexagonal Phospholipid Neutral: 5 s
Homocysteine: 10.6 umol/L
PAI-1 Activity: 24.3 IU/mL
Protein S Antigen, Free: 105 %
Prt C Activity (Chromogenic): 136 %

## 2022-07-27 LAB — CHROMOSOME, BLOOD, ROUTINE
Cells Analyzed: 20
Cells Counted: 20
Cells Karyotyped: 2
GTG Band Resolution Achieved: 500

## 2022-07-27 LAB — BETA HCG QUANT (REF LAB): hCG Quant: 224 m[IU]/mL

## 2022-07-27 NOTE — Progress Notes (Signed)
37 yo here to discuss test results. Patient diagnosed with a miscarriage a few weeks ago. She reports doing well and denies any pelvic pain or abnormal bleeding/discharge. Patient with an obstetrical history significant for 3 first trimester miscarriage, each with different partners.  Past Medical History:  Diagnosis Date   Eczema    Past Surgical History:  Procedure Laterality Date   NO PAST SURGERIES     Family History  Problem Relation Age of Onset   Allergic rhinitis Mother    Allergic rhinitis Father    Eczema Sister    Eczema Brother    Social History   Tobacco Use   Smoking status: Never   Smokeless tobacco: Never  Vaping Use   Vaping Use: Never used  Substance Use Topics   Alcohol use: Yes    Alcohol/week: 1.0 standard drink of alcohol    Types: 1 Glasses of wine per week    Comment: occ before pregnancy   Drug use: No   ROS See pertinent in HPI. All other systems reviewed and non contributory Blood pressure 116/75, pulse 62, weight 210 lb (95.3 kg), last menstrual period 04/14/2022. GENERAL: Well-developed, well-nourished female in no acute distress.  NEURO: alert and oriented x 3  A/P 37 yo with recent miscarriage here to discuss recent test results - Results reviewed with patient and reassurance provided - quant HCG ordered today along with TSH and A1c - Patient will be contacted with abnormal results - Patient declines contraception at this time - She is interested in referral to genetic counselor

## 2022-07-28 LAB — HEMOGLOBIN A1C
Est. average glucose Bld gHb Est-mCnc: 108 mg/dL
Hgb A1c MFr Bld: 5.4 % (ref 4.8–5.6)

## 2022-07-28 LAB — BETA HCG QUANT (REF LAB): hCG Quant: 5 m[IU]/mL

## 2022-07-28 LAB — TSH: TSH: 0.551 u[IU]/mL (ref 0.450–4.500)

## 2022-08-03 ENCOUNTER — Ambulatory Visit: Payer: 59 | Attending: Obstetrics and Gynecology

## 2022-08-03 DIAGNOSIS — N96 Recurrent pregnancy loss: Secondary | ICD-10-CM

## 2022-08-03 DIAGNOSIS — Z419 Encounter for procedure for purposes other than remedying health state, unspecified: Secondary | ICD-10-CM | POA: Diagnosis not present

## 2022-08-03 NOTE — Progress Notes (Signed)
[facility] at [facility] [address] Phone:  [phone]   Fax:  [phone]    Name: Molly Dawson Indication: Recurrent Pregnancy Loss  DOB: [birth year] Age: 36 y.o.   EDD: Patient not currently pregnant LMP: 07/30/2022 Referring Provider:  Mora Bellman, MD  EGA: N/A Genetic Counselor: Staci Righter, MS, [place]  OB Hx: [id number] Date of Appointment: 08/03/2022  Accompanied by: Her reproductive partner, Tery Sanfilippo Face to Face Time: 75 Minutes   Family History: A pedigree was created and scanned into Epic under the Media tab. Jisell and Ranaldo report their ethnicity to be Research scientist (physical sciences). Denies Ashkenazi Jewish ancestry. Family history not remarkable for consanguinity, individuals with birth defects, intellectual disability, or autism spectrum disorder.     Genetic Counseling:   Recurrent Pregnancy Loss. Molly Dawson reports she has had a total of 5 pregnancies. She has a living, healthy daughter. She has had 4 losses, two of which were spontaneous. Recurrent Pregnancy Loss (RPL) is defined as three or more clinically recognized consecutive or non-consecutive pregnancy losses occurring prior to fetal viability (<[redacted] weeks gestation). Pregnancy losses occur for many reasons, including endocrine factors (i.e. uncontrolled diabetes mellitus, luteal phase deficiency, or polycystic ovarian syndrome), environmental agents, immunologic causes (i.e. antiphospholipid syndrome), maternal factors (i.e. uterine anatomic malformations, cervical abnormalities), as well as chromosomal and single-gene disorders. Marc was counseled that the overall rate of pregnancy loss is approximately 20%, with the majority occurring in the first trimester. We discussed that chromosomal causes account for 50-70% of first trimester losses but are less commonly cited as a cause for late gestational pregnancy loss. We reviewed that most chromosome  abnormalities are a result of a new change in the sperm or egg that conceived the fetus; however, in 3-5% of all couples with RPL one member of the couple is found to have a balanced chromosome rearrangement, called a translocation. A translocation occurs when there is an exchange in chromosome material, such as when a segment of one chromosome breaks off and reattaches to a different chromosome. When the translocation is balanced, there is no extra or missing genetic material; however, an individual who carries a balanced translocation has an increased risk to pass unbalanced chromosomes to their offspring. In couples in which one partner is a carrier of a balanced translocation, this can result in an increased risk for infertility, RPL, and offspring with abnormal chromosomes. Due to Chandi's history of RPL, chromosome analysis was offered. 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 Given Mayrin's normal karyotype, genetic counseling offered chromosome analysis for Ranaldo, which he accepted. We discussed that this testing may help to inform the cause of the previous pregnancy losses, and may help the couple make informed reproductive decisions moving forward.  Routine Carrier screening. Per the ACOG Committee Opinion 691, all women who are considering a pregnancy or are currently pregnant should be offered carrier screening for, at minimum, Cystic Fibrosis (CF), Spinal Muscular Atrophy (SMA), and Hemoglobinopathies. The mode of inheritance, clinical manifestations of these conditions, as well as details about testing were reviewed. A negative result on carrier screening reduces the likelihood of being a carrier, however, does not entirely rule out the possibility. If Shereese was found to be a carrier for a specific condition, carrier screening for Ranaldo would be recommended.  Expanded Carrier screening. We discussed that expanded  carrier screening (ECS) is a testing option available that evaluates  for a wide range of genetic conditions. Some of these conditions are severe and actionable, but also rare; others occur more commonly, but are less severe. We discussed that testing panels could include more than 500 autosomal recessive or X-linked genetic conditions. In the event that one partner were found to be a carrier for one or more conditions, carrier screening would be available to the other partner for those conditions. This could help determine whether any future pregnancies are at increased risk for a particular recessive or X-linked genetic condition. We discussed the risks, benefits, and limitations of carrier screening.    Patient Plan:  Proceed with: Chromosome analysis for Ranaldo. Ranaldo was offered the CPT codes and ICD-10 codes that would be used to order his chromosome analysis so that he could call his insurance for an estimate regarding coverage and his out of pocket cost. Ranaldo opted to move forward with testing without calling his insurance for the above information.  Informed consent was obtained. All questions were answered.  Declined: Carrier screening for Cystic Fibrosis, Spinal Muscular Atrophy, and Hemoglobinopathies and Expanded Carrier Screening   Thank you for sharing in the care of Alanis with [place].  Please do not hesitate to contact us if you have any questions.  Staci Righter, MS, [facility]

## 2022-09-01 DIAGNOSIS — Z419 Encounter for procedure for purposes other than remedying health state, unspecified: Secondary | ICD-10-CM | POA: Diagnosis not present

## 2022-09-04 ENCOUNTER — Other Ambulatory Visit: Payer: 59

## 2022-09-04 ENCOUNTER — Ambulatory Visit: Payer: 59

## 2022-09-27 LAB — OB RESULTS CONSOLE HIV ANTIBODY (ROUTINE TESTING): HIV: NONREACTIVE

## 2022-09-27 LAB — OB RESULTS CONSOLE HEPATITIS B SURFACE ANTIGEN: Hepatitis B Surface Ag: NEGATIVE

## 2022-09-27 LAB — HEPATITIS C ANTIBODY: HCV Ab: NEGATIVE

## 2022-10-02 DIAGNOSIS — Z419 Encounter for procedure for purposes other than remedying health state, unspecified: Secondary | ICD-10-CM | POA: Diagnosis not present

## 2022-10-10 LAB — OB RESULTS CONSOLE GC/CHLAMYDIA
Chlamydia: NEGATIVE
Neisseria Gonorrhea: NEGATIVE

## 2022-11-01 DIAGNOSIS — Z419 Encounter for procedure for purposes other than remedying health state, unspecified: Secondary | ICD-10-CM | POA: Diagnosis not present

## 2022-12-02 DIAGNOSIS — Z419 Encounter for procedure for purposes other than remedying health state, unspecified: Secondary | ICD-10-CM | POA: Diagnosis not present

## 2022-12-19 DIAGNOSIS — Z363 Encounter for antenatal screening for malformations: Secondary | ICD-10-CM | POA: Diagnosis not present

## 2022-12-19 DIAGNOSIS — Z3A19 19 weeks gestation of pregnancy: Secondary | ICD-10-CM | POA: Diagnosis not present

## 2022-12-19 DIAGNOSIS — O09522 Supervision of elderly multigravida, second trimester: Secondary | ICD-10-CM | POA: Diagnosis not present

## 2023-01-01 DIAGNOSIS — Z419 Encounter for procedure for purposes other than remedying health state, unspecified: Secondary | ICD-10-CM | POA: Diagnosis not present

## 2023-01-22 DIAGNOSIS — Z362 Encounter for other antenatal screening follow-up: Secondary | ICD-10-CM | POA: Diagnosis not present

## 2023-01-22 DIAGNOSIS — Z3A24 24 weeks gestation of pregnancy: Secondary | ICD-10-CM | POA: Diagnosis not present

## 2023-02-01 DIAGNOSIS — Z419 Encounter for procedure for purposes other than remedying health state, unspecified: Secondary | ICD-10-CM | POA: Diagnosis not present

## 2023-02-20 DIAGNOSIS — Z3A28 28 weeks gestation of pregnancy: Secondary | ICD-10-CM | POA: Diagnosis not present

## 2023-02-20 DIAGNOSIS — Z3480 Encounter for supervision of other normal pregnancy, unspecified trimester: Secondary | ICD-10-CM | POA: Diagnosis not present

## 2023-02-20 DIAGNOSIS — Z23 Encounter for immunization: Secondary | ICD-10-CM | POA: Diagnosis not present

## 2023-02-27 ENCOUNTER — Encounter: Payer: Self-pay | Admitting: Oncology

## 2023-02-27 ENCOUNTER — Inpatient Hospital Stay: Payer: Medicaid Other | Attending: Oncology | Admitting: Oncology

## 2023-02-27 ENCOUNTER — Inpatient Hospital Stay: Payer: Medicaid Other

## 2023-02-27 VITALS — BP 93/63 | HR 103 | Temp 97.6°F | Resp 20 | Wt 232.4 lb

## 2023-02-27 DIAGNOSIS — O99013 Anemia complicating pregnancy, third trimester: Secondary | ICD-10-CM

## 2023-02-27 DIAGNOSIS — Z3A30 30 weeks gestation of pregnancy: Secondary | ICD-10-CM | POA: Diagnosis not present

## 2023-02-27 DIAGNOSIS — D649 Anemia, unspecified: Secondary | ICD-10-CM | POA: Diagnosis not present

## 2023-02-27 LAB — CBC (CANCER CENTER ONLY)
HCT: 26.2 % — ABNORMAL LOW (ref 36.0–46.0)
Hemoglobin: 8.7 g/dL — ABNORMAL LOW (ref 12.0–15.0)
MCH: 22.5 pg — ABNORMAL LOW (ref 26.0–34.0)
MCHC: 33.2 g/dL (ref 30.0–36.0)
MCV: 67.7 fL — ABNORMAL LOW (ref 80.0–100.0)
Platelet Count: 350 10*3/uL (ref 150–400)
RBC: 3.87 MIL/uL (ref 3.87–5.11)
RDW: 18.3 % — ABNORMAL HIGH (ref 11.5–15.5)
WBC Count: 12.1 10*3/uL — ABNORMAL HIGH (ref 4.0–10.5)
nRBC: 0 % (ref 0.0–0.2)

## 2023-02-27 LAB — IRON AND TIBC
Iron: 55 ug/dL (ref 28–170)
Saturation Ratios: 10 % — ABNORMAL LOW (ref 10.4–31.8)
TIBC: 574 ug/dL — ABNORMAL HIGH (ref 250–450)
UIBC: 519 ug/dL

## 2023-02-27 LAB — FOLATE: Folate: 19.2 ng/mL (ref >5.9)

## 2023-02-27 LAB — VITAMIN B12: Vitamin B-12: 315 pg/mL (ref 180–914)

## 2023-02-27 LAB — FERRITIN: Ferritin: 4 ng/mL — ABNORMAL LOW (ref 11–307)

## 2023-02-27 NOTE — Progress Notes (Signed)
[facility]  Telephone:([phone] Fax:([phone]  ID: Molly Dawson OB: [birth year]  MR#: [id number]  AOZ#:[id number]  Patient Care Team: Pcp, No as PCP - General  CHIEF COMPLAINT: Anemia in pregnancy.  INTERVAL HISTORY: Patient is a 38 year old female in the third trimester of pregnancy who was noted to have a significantly reduced hemoglobin.  She also has a noted history of Hemoglobin C variant.  She currently feels well and is asymptomatic.  She does not complain of any weakness or fatigue.  She is gaining weight appropriately.  She has no neurologic complaints.  She denies any recent fevers or illnesses.  She has no chest pain, shortness of breath, cough, or hemoptysis.  She denies any nausea, vomiting, constipation, or diarrhea.  She has no urinary complaints.  Patient offers no specific complaints today.  REVIEW OF SYSTEMS:   Review of Systems  Constitutional: Negative.  Negative for fever, malaise/fatigue and weight loss.  Respiratory: Negative.  Negative for cough, hemoptysis and shortness of breath.   Cardiovascular: Negative.  Negative for chest pain and leg swelling.  Gastrointestinal: Negative.  Negative for abdominal pain and nausea.  Genitourinary: Negative.  Negative for hematuria.  Musculoskeletal: Negative.  Negative for back pain.  Skin: Negative.  Negative for rash.  Neurological: Negative.  Negative for dizziness, focal weakness, weakness and headaches.  Psychiatric/Behavioral: Negative.  The patient is not nervous/anxious.     As per HPI. Otherwise, a complete review of systems is negative.  PAST MEDICAL HISTORY: Past Medical History:  Diagnosis Date   Eczema     PAST SURGICAL HISTORY: Past Surgical History:  Procedure Laterality Date   NO PAST SURGERIES      FAMILY HISTORY: Family History  Problem Relation Age of Onset   Allergic rhinitis Mother    Allergic rhinitis Father    Eczema Sister    Eczema Brother     ADVANCED  DIRECTIVES (Y/N):  N  HEALTH MAINTENANCE: Social History   Tobacco Use   Smoking status: Never   Smokeless tobacco: Never  Vaping Use   Vaping status: Never Used  Substance Use Topics   Alcohol use: Yes    Alcohol/week: 1.0 standard drink of alcohol    Types: 1 Glasses of wine per week    Comment: occ before pregnancy   Drug use: No     Colonoscopy:  PAP:  Bone density:  Lipid panel:  No Known Allergies  Current Outpatient Medications  Medication Sig Dispense Refill   Prenatal 27-1 MG TABS Take 1 tablet by mouth daily. 30 tablet 12   Blood Pressure Monitoring (BLOOD PRESSURE KIT) DEVI 1 Device by Does not apply route once a week. (Patient not taking: Reported on 07/07/2022) 1 each 0   No current facility-administered medications for this visit.    OBJECTIVE: Vitals:   02/27/23 1344  BP: 93/63  Pulse: (!) 103  Resp: 20  Temp: 97.6 F (36.4 C)  SpO2: 100%     Body mass index is 41.17 kg/m.    ECOG FS:0 - Asymptomatic  General: Well-developed, well-nourished, no acute distress. Eyes: Pink conjunctiva, anicteric sclera. HEENT: Normocephalic, moist mucous membranes. Lungs: No audible wheezing or coughing. Heart: Regular rate and rhythm. Abdomen: Appears appropriate for gestational age.   Musculoskeletal: No edema, cyanosis, or clubbing. Neuro: Alert, answering all questions appropriately. Cranial nerves grossly intact. Skin: No rashes or petechiae noted. Psych: Normal affect. Lymphatics: No cervical, calvicular, axillary or inguinal LAD.   LAB RESULTS:  No results found  for: "NA", "K", "CL", "CO2", "GLUCOSE", "BUN", "CREATININE", "CALCIUM", "PROT", "ALBUMIN", "AST", "ALT", "ALKPHOS", "BILITOT", "GFRNONAA", "GFRAA"  Lab Results  Component Value Date   WBC 12.1 (H) 02/27/2023   HGB 8.7 (L) 02/27/2023   HCT 26.2 (L) 02/27/2023   MCV 67.7 (L) 02/27/2023   PLT 350 02/27/2023     STUDIES: No results found.  ASSESSMENT: Anemia in pregnancy.  PLAN:     Anemia in pregnancy: Patient's most recent laboratory work revealed a decreased hemoglobin of 8.7 as well as reduced iron stores.  Patient previously noted to have hemoglobin C variant which may be contributing to to her anemia as well as microcytosis.  Patient will benefit from IV Feraheme x 2 and will return to clinic in the next several weeks for treatment.  Patient will then return to clinic at the end of October for repeat laboratory work, further evaluation, and consideration of additional IV Feraheme if needed. Pregnancy: Patient reports her due date is May 11, 2023.   I spent a total of 45 minutes reviewing chart data, face-to-face evaluation with the patient, counseling and coordination of care as detailed above.   Patient expressed understanding and was in agreement with this plan. She also understands that She can call clinic at any time with any questions, concerns, or complaints.    Jeralyn Ruths, MD   02/27/2023 3:14 PM

## 2023-03-08 ENCOUNTER — Inpatient Hospital Stay: Payer: 59 | Attending: Oncology

## 2023-03-08 VITALS — BP 110/75 | HR 86 | Temp 98.8°F | Resp 18

## 2023-03-08 DIAGNOSIS — Z3A00 Weeks of gestation of pregnancy not specified: Secondary | ICD-10-CM | POA: Diagnosis not present

## 2023-03-08 DIAGNOSIS — O99013 Anemia complicating pregnancy, third trimester: Secondary | ICD-10-CM | POA: Diagnosis present

## 2023-03-08 DIAGNOSIS — D649 Anemia, unspecified: Secondary | ICD-10-CM | POA: Insufficient documentation

## 2023-03-08 MED ORDER — SODIUM CHLORIDE 0.9 % IV SOLN
510.0000 mg | Freq: Once | INTRAVENOUS | Status: AC
  Administered 2023-03-08: 510 mg via INTRAVENOUS
  Filled 2023-03-08: qty 17

## 2023-03-08 MED ORDER — SODIUM CHLORIDE 0.9 % IV SOLN
Freq: Once | INTRAVENOUS | Status: AC
  Filled 2023-03-08: qty 250

## 2023-03-08 MED ORDER — SODIUM CHLORIDE 0.9% FLUSH
10.0000 mL | Freq: Once | INTRAVENOUS | Status: AC | PRN
  Administered 2023-03-08: 10 mL
  Filled 2023-03-08: qty 10

## 2023-03-08 NOTE — Patient Instructions (Addendum)
 Iron Sucrose Injection What is this medication? IRON SUCROSE (EYE ern SOO krose) treats low levels of iron (iron deficiency anemia) in people with kidney disease. Iron is a mineral that plays an important role in making red blood cells, which carry oxygen from your lungs to the rest of your body. This medicine may be used for other purposes; ask your health care provider or pharmacist if you have questions. COMMON BRAND NAME(S): Venofer What should I tell my care team before I take this medication? They need to know if you have any of these conditions: Anemia not caused by low iron levels Heart disease High levels of iron in the blood Kidney disease Liver disease An unusual or allergic reaction to iron, other medications, foods, dyes, or preservatives Pregnant or trying to get pregnant Breastfeeding How should I use this medication? This medication is for infusion into a vein. It is given in a hospital or clinic setting. Talk to your care team about the use of this medication in children. While this medication may be prescribed for children as young as 2 years for selected conditions, precautions do apply. Overdosage: If you think you have taken too much of this medicine contact a poison control center or emergency room at once. NOTE: This medicine is only for you. Do not share this medicine with others. What if I miss a dose? Keep appointments for follow-up doses. It is important not to miss your dose. Call your care team if you are unable to keep an appointment. What may interact with this medication? Do not take this medication with any of the following: Deferoxamine Dimercaprol Other iron products This medication may also interact with the following: Chloramphenicol Deferasirox This list may not describe all possible interactions. Give your health care provider a list of all the medicines, herbs, non-prescription drugs, or dietary supplements you use. Also tell them if you smoke,  drink alcohol, or use illegal drugs. Some items may interact with your medicine. What should I watch for while using this medication? Visit your care team regularly. Tell your care team if your symptoms do not start to get better or if they get worse. You may need blood work done while you are taking this medication. You may need to follow a special diet. Talk to your care team. Foods that contain iron include: whole grains/cereals, dried fruits, beans, or peas, leafy green vegetables, and organ meats (liver, kidney). What side effects may I notice from receiving this medication? Side effects that you should report to your care team as soon as possible: Allergic reactions--skin rash, itching, hives, swelling of the face, lips, tongue, or throat Low blood pressure--dizziness, feeling faint or lightheaded, blurry vision Shortness of breath Side effects that usually do not require medical attention (report to your care team if they continue or are bothersome): Flushing Headache Joint pain Muscle pain Nausea Pain, redness, or irritation at injection site This list may not describe all possible side effects. Call your doctor for medical advice about side effects. You may report side effects to FDA at 1-800-FDA-1088. Where should I keep my medication? This medication is given in a hospital or clinic. It will not be stored at home. NOTE: This sheet is a summary. It may not cover all possible information. If you have questions about this medicine, talk to your doctor, pharmacist, or health care provider.  2024 Elsevier/Gold Standard (2022-11-24 00:00:00)

## 2023-03-10 ENCOUNTER — Encounter: Payer: Self-pay | Admitting: Oncology

## 2023-03-12 ENCOUNTER — Other Ambulatory Visit: Payer: Self-pay | Admitting: Oncology

## 2023-03-15 ENCOUNTER — Inpatient Hospital Stay: Payer: 59

## 2023-03-15 VITALS — BP 96/62 | HR 90 | Temp 98.0°F | Resp 18

## 2023-03-15 DIAGNOSIS — O99013 Anemia complicating pregnancy, third trimester: Secondary | ICD-10-CM | POA: Diagnosis not present

## 2023-03-15 MED ORDER — SODIUM CHLORIDE 0.9 % IV SOLN
200.0000 mg | Freq: Once | INTRAVENOUS | Status: AC
  Administered 2023-03-15: 200 mg via INTRAVENOUS
  Filled 2023-03-15: qty 200

## 2023-03-15 MED ORDER — SODIUM CHLORIDE 0.9 % IV SOLN
Freq: Once | INTRAVENOUS | Status: AC
  Filled 2023-03-15: qty 250

## 2023-03-22 ENCOUNTER — Inpatient Hospital Stay: Payer: 59

## 2023-03-22 VITALS — BP 105/70 | HR 96 | Temp 97.3°F | Resp 18

## 2023-03-22 DIAGNOSIS — O99013 Anemia complicating pregnancy, third trimester: Secondary | ICD-10-CM

## 2023-03-22 MED ORDER — SODIUM CHLORIDE 0.9 % IV SOLN
Freq: Once | INTRAVENOUS | Status: AC
  Filled 2023-03-22: qty 250

## 2023-03-22 MED ORDER — SODIUM CHLORIDE 0.9 % IV SOLN
200.0000 mg | Freq: Once | INTRAVENOUS | Status: AC
  Administered 2023-03-22: 200 mg via INTRAVENOUS
  Filled 2023-03-22: qty 200

## 2023-03-22 NOTE — Patient Instructions (Signed)
Iron Sucrose Injection What is this medication? IRON SUCROSE (EYE ern SOO krose) treats low levels of iron (iron deficiency anemia) in people with kidney disease. Iron is a mineral that plays an important role in making red blood cells, which carry oxygen from your lungs to the rest of your body. This medicine may be used for other purposes; ask your health care provider or pharmacist if you have questions. COMMON BRAND NAME(S): Venofer What should I tell my care team before I take this medication? They need to know if you have any of these conditions: Anemia not caused by low iron levels Heart disease High levels of iron in the blood Kidney disease Liver disease An unusual or allergic reaction to iron, other medications, foods, dyes, or preservatives Pregnant or trying to get pregnant Breastfeeding How should I use this medication? This medication is for infusion into a vein. It is given in a hospital or clinic setting. Talk to your care team about the use of this medication in children. While this medication may be prescribed for children as young as 2 years for selected conditions, precautions do apply. Overdosage: If you think you have taken too much of this medicine contact a poison control center or emergency room at once. NOTE: This medicine is only for you. Do not share this medicine with others. What if I miss a dose? Keep appointments for follow-up doses. It is important not to miss your dose. Call your care team if you are unable to keep an appointment. What may interact with this medication? Do not take this medication with any of the following: Deferoxamine Dimercaprol Other iron products This medication may also interact with the following: Chloramphenicol Deferasirox This list may not describe all possible interactions. Give your health care provider a list of all the medicines, herbs, non-prescription drugs, or dietary supplements you use. Also tell them if you smoke,  drink alcohol, or use illegal drugs. Some items may interact with your medicine. What should I watch for while using this medication? Visit your care team regularly. Tell your care team if your symptoms do not start to get better or if they get worse. You may need blood work done while you are taking this medication. You may need to follow a special diet. Talk to your care team. Foods that contain iron include: whole grains/cereals, dried fruits, beans, or peas, leafy green vegetables, and organ meats (liver, kidney). What side effects may I notice from receiving this medication? Side effects that you should report to your care team as soon as possible: Allergic reactions--skin rash, itching, hives, swelling of the face, lips, tongue, or throat Low blood pressure--dizziness, feeling faint or lightheaded, blurry vision Shortness of breath Side effects that usually do not require medical attention (report to your care team if they continue or are bothersome): Flushing Headache Joint pain Muscle pain Nausea Pain, redness, or irritation at injection site This list may not describe all possible side effects. Call your doctor for medical advice about side effects. You may report side effects to FDA at 1-800-FDA-1088. Where should I keep my medication? This medication is given in a hospital or clinic. It will not be stored at home. NOTE: This sheet is a summary. It may not cover all possible information. If you have questions about this medicine, talk to your doctor, pharmacist, or health care provider.  2024 Elsevier/Gold Standard (2022-11-24 00:00:00)

## 2023-03-29 ENCOUNTER — Inpatient Hospital Stay: Payer: 59

## 2023-03-29 VITALS — BP 108/72 | HR 107 | Resp 16

## 2023-03-29 DIAGNOSIS — O99013 Anemia complicating pregnancy, third trimester: Secondary | ICD-10-CM

## 2023-03-29 MED ORDER — SODIUM CHLORIDE 0.9 % IV SOLN
Freq: Once | INTRAVENOUS | Status: AC
  Filled 2023-03-29: qty 250

## 2023-03-29 MED ORDER — SODIUM CHLORIDE 0.9 % IV SOLN
200.0000 mg | Freq: Once | INTRAVENOUS | Status: AC
  Administered 2023-03-29: 200 mg via INTRAVENOUS
  Filled 2023-03-29: qty 200

## 2023-04-03 DIAGNOSIS — Z419 Encounter for procedure for purposes other than remedying health state, unspecified: Secondary | ICD-10-CM | POA: Diagnosis not present

## 2023-04-16 DIAGNOSIS — Z363 Encounter for antenatal screening for malformations: Secondary | ICD-10-CM | POA: Diagnosis not present

## 2023-04-16 LAB — OB RESULTS CONSOLE GBS: GBS: NEGATIVE

## 2023-04-27 ENCOUNTER — Other Ambulatory Visit: Payer: Self-pay | Admitting: *Deleted

## 2023-04-27 DIAGNOSIS — O99013 Anemia complicating pregnancy, third trimester: Secondary | ICD-10-CM

## 2023-04-30 ENCOUNTER — Inpatient Hospital Stay: Payer: 59

## 2023-05-01 ENCOUNTER — Inpatient Hospital Stay (HOSPITAL_COMMUNITY): Payer: 59 | Admitting: Anesthesiology

## 2023-05-01 ENCOUNTER — Encounter: Payer: Self-pay | Admitting: Oncology

## 2023-05-01 ENCOUNTER — Other Ambulatory Visit: Payer: Self-pay

## 2023-05-01 ENCOUNTER — Inpatient Hospital Stay: Payer: 59 | Admitting: Oncology

## 2023-05-01 ENCOUNTER — Inpatient Hospital Stay (HOSPITAL_COMMUNITY)
Admission: AD | Admit: 2023-05-01 | Discharge: 2023-05-04 | DRG: 787 | Disposition: A | Discharge status: Home / Self Care | Payer: 59 | Attending: Obstetrics and Gynecology | Admitting: Obstetrics and Gynecology

## 2023-05-01 ENCOUNTER — Encounter (HOSPITAL_COMMUNITY): Payer: Self-pay | Admitting: Obstetrics and Gynecology

## 2023-05-01 ENCOUNTER — Inpatient Hospital Stay: Payer: 59

## 2023-05-01 DIAGNOSIS — Z3A38 38 weeks gestation of pregnancy: Secondary | ICD-10-CM

## 2023-05-01 DIAGNOSIS — O9081 Anemia of the puerperium: Secondary | ICD-10-CM | POA: Diagnosis not present

## 2023-05-01 DIAGNOSIS — Z3A00 Weeks of gestation of pregnancy not specified: Secondary | ICD-10-CM | POA: Diagnosis not present

## 2023-05-01 DIAGNOSIS — Z23 Encounter for immunization: Secondary | ICD-10-CM | POA: Diagnosis not present

## 2023-05-01 DIAGNOSIS — D62 Acute posthemorrhagic anemia: Secondary | ICD-10-CM | POA: Diagnosis not present

## 2023-05-01 DIAGNOSIS — Q833 Accessory nipple: Secondary | ICD-10-CM | POA: Diagnosis not present

## 2023-05-01 DIAGNOSIS — O26893 Other specified pregnancy related conditions, third trimester: Secondary | ICD-10-CM | POA: Diagnosis not present

## 2023-05-01 DIAGNOSIS — Z419 Encounter for procedure for purposes other than remedying health state, unspecified: Secondary | ICD-10-CM | POA: Diagnosis not present

## 2023-05-01 HISTORY — DX: Anxiety disorder, unspecified: F41.9

## 2023-05-01 LAB — CBC
HCT: 35.5 % — ABNORMAL LOW (ref 36.0–46.0)
Hemoglobin: 12.2 g/dL (ref 12.0–15.0)
MCH: 25.4 pg — ABNORMAL LOW (ref 26.0–34.0)
MCHC: 34.4 g/dL (ref 30.0–36.0)
MCV: 74 fL — ABNORMAL LOW (ref 80.0–100.0)
Platelets: 231 10*3/uL (ref 150–400)
RBC: 4.8 MIL/uL (ref 3.87–5.11)
RDW: 21.7 % — ABNORMAL HIGH (ref 11.5–15.5)
WBC: 12.7 10*3/uL — ABNORMAL HIGH (ref 4.0–10.5)
nRBC: 0 % (ref 0.0–0.2)

## 2023-05-01 LAB — WET PREP, GENITAL
Sperm: NONE SEEN
Trich, Wet Prep: NONE SEEN
WBC, Wet Prep HPF POC: <10 (ref <10)
Yeast Wet Prep HPF POC: NONE SEEN

## 2023-05-01 LAB — TYPE AND SCREEN
ABO/RH(D): O POS
Antibody Screen: NEGATIVE

## 2023-05-01 LAB — RPR: RPR Ser Ql: NONREACTIVE

## 2023-05-01 MED ORDER — FENTANYL CITRATE (PF) 100 MCG/2ML IJ SOLN: 100.0000 ug | INTRAMUSCULAR | Status: DC | PRN

## 2023-05-01 MED ORDER — LIDOCAINE-EPINEPHRINE (PF) 2 %-1:200000 IJ SOLN
INTRAMUSCULAR | Status: DC | PRN
  Administered 2023-05-01 – 2023-05-02 (×4): 5 mL via EPIDURAL

## 2023-05-01 MED ORDER — OXYTOCIN BOLUS FROM INFUSION
333.0000 mL | Freq: Once | INTRAVENOUS | Status: DC
Start: 2023-05-01 — End: 2023-05-02

## 2023-05-01 MED ORDER — DIPHENHYDRAMINE HCL 50 MG/ML IJ SOLN
12.5000 mg | INTRAMUSCULAR | Status: DC | PRN
  Administered 2023-05-02: 12.5 mg via INTRAVENOUS
  Filled 2023-05-01: qty 1

## 2023-05-01 MED ORDER — ONDANSETRON HCL 4 MG/2ML IJ SOLN
4.0000 mg | Freq: Four times a day (QID) | INTRAMUSCULAR | Status: DC | PRN
  Administered 2023-05-01: 4 mg via INTRAVENOUS
  Filled 2023-05-01: qty 2

## 2023-05-01 MED ORDER — OXYTOCIN-SODIUM CHLORIDE 30-0.9 UT/500ML-% IV SOLN
1.0000 m[IU]/min | INTRAVENOUS | Status: DC
Start: 2023-05-01 — End: 2023-05-02
  Administered 2023-05-01: 2 m[IU]/min via INTRAVENOUS

## 2023-05-01 MED ORDER — PHENYLEPHRINE 80 MCG/ML (10ML) SYRINGE FOR IV PUSH (FOR BLOOD PRESSURE SUPPORT): 80.0000 ug | PREFILLED_SYRINGE | INTRAVENOUS | Status: DC | PRN

## 2023-05-01 MED ORDER — EPHEDRINE 5 MG/ML INJ: 10.0000 mg | INTRAVENOUS | Status: DC | PRN

## 2023-05-01 MED ORDER — OXYCODONE-ACETAMINOPHEN 5-325 MG PO TABS: 1.0000 | ORAL_TABLET | ORAL | Status: DC | PRN

## 2023-05-01 MED ORDER — MISOPROSTOL 50MCG HALF TABLET
50.0000 ug | ORAL_TABLET | ORAL | Status: DC
  Administered 2023-05-01: 50 ug via VAGINAL
  Filled 2023-05-01: qty 1

## 2023-05-01 MED ORDER — FLEET ENEMA RE ENEM: 1.0000 | ENEMA | RECTAL | Status: DC | PRN

## 2023-05-01 MED ORDER — LACTATED RINGERS IV SOLN
500.0000 mL | INTRAVENOUS | Status: DC | PRN
Start: 2023-05-01 — End: 2023-05-02
  Administered 2023-05-02: 200 mL via INTRAVENOUS

## 2023-05-01 MED ORDER — LACTATED RINGERS IV SOLN
500.0000 mL | Freq: Once | INTRAVENOUS | Status: AC
  Administered 2023-05-01: 500 mL via INTRAVENOUS

## 2023-05-01 MED ORDER — LACTATED RINGERS IV SOLN: INTRAVENOUS | Status: DC

## 2023-05-01 MED ORDER — OXYCODONE-ACETAMINOPHEN 5-325 MG PO TABS: 2.0000 | ORAL_TABLET | ORAL | Status: DC | PRN

## 2023-05-01 MED ORDER — SOD CITRATE-CITRIC ACID 500-334 MG/5ML PO SOLN
30.0000 mL | ORAL | Status: DC | PRN
  Administered 2023-05-02: 30 mL via ORAL
  Filled 2023-05-01: qty 30

## 2023-05-01 MED ORDER — PHENYLEPHRINE 80 MCG/ML (10ML) SYRINGE FOR IV PUSH (FOR BLOOD PRESSURE SUPPORT)
80.0000 ug | PREFILLED_SYRINGE | INTRAVENOUS | Status: DC | PRN
  Filled 2023-05-01: qty 10

## 2023-05-01 MED ORDER — ACETAMINOPHEN 325 MG PO TABS: 650.0000 mg | ORAL_TABLET | ORAL | Status: DC | PRN

## 2023-05-01 MED ORDER — LIDOCAINE HCL (PF) 1 % IJ SOLN: 30.0000 mL | INTRAMUSCULAR | Status: DC | PRN

## 2023-05-01 MED ORDER — OXYTOCIN-SODIUM CHLORIDE 30-0.9 UT/500ML-% IV SOLN
2.5000 [IU]/h | INTRAVENOUS | Status: DC
  Filled 2023-05-01: qty 500

## 2023-05-01 MED ORDER — LACTATED RINGERS IV BOLUS
1000.0000 mL | Freq: Once | INTRAVENOUS | Status: AC
  Administered 2023-05-01: 1000 mL via INTRAVENOUS

## 2023-05-01 MED ORDER — BUPIVACAINE HCL (PF) 0.25 % IJ SOLN
INTRAMUSCULAR | Status: DC | PRN
  Administered 2023-05-01 (×2): 5 mL via EPIDURAL
  Administered 2023-05-02: 4 mL via EPIDURAL
  Administered 2023-05-02 (×2): 5 mL via EPIDURAL
  Administered 2023-05-02: 4 mL via EPIDURAL

## 2023-05-01 MED ORDER — TERBUTALINE SULFATE 1 MG/ML IJ SOLN: 0.2500 mg | Freq: Once | INTRAMUSCULAR | Status: DC | PRN

## 2023-05-01 MED ORDER — FENTANYL-BUPIVACAINE-NACL 0.5-0.125-0.9 MG/250ML-% EP SOLN
12.0000 mL/h | EPIDURAL | Status: DC | PRN
  Administered 2023-05-01: 12 mL/h via EPIDURAL
  Filled 2023-05-01: qty 250

## 2023-05-01 NOTE — H&P (Addendum)
Molly Dawson is a 37 y.o. female presenting for early labor sxs.  In MAU had a 3 minute prolonged decel that responded to position change.  Noted to be in early labor but had bloody show and the MAU provider recommended admission for delivery.  GBS neg and otherwsie uncomplicated pregnancy. Fe def anemia corrected with oral Fe OB History     Gravida  6   Para  1   Term  1   Preterm      AB  4   Living  1      SAB  2   IAB  2   Ectopic      Multiple      Live Births  1          Past Medical History:  Diagnosis Date   Anxiety    Eczema    Past Surgical History:  Procedure Laterality Date   NO PAST SURGERIES     Family History: family history includes Allergic rhinitis in her father and mother; Eczema in her brother and sister. Social History:  reports that she has never smoked. She has never used smokeless tobacco. She reports current alcohol use of about 1.0 standard drink of alcohol per week. She reports that she does not use drugs.     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 rate 18, height 5\' 3"  (1.6 m), weight 111.7 kg, SpO2 100%, unknown if currently breastfeeding. Exam Physical Exam Vitals and nursing note reviewed. Exam conducted with a chaperone present.  Constitutional:      Appearance: Normal appearance.  HENT:     Head: Normocephalic.  Eyes:     Pupils: Pupils are equal, round, and reactive to light.   Cardiovascular:     Rate and Rhythm: Normal rate and regular rhythm.     Pulses: Normal pulses.  Abdominal:     General: Abdomen is Gravid, nontender Neurological:     Mental Status: She is alert. Prenatal labs: ABO, Rh: --/--/O POS (10/29 1043) Antibody: NEG (10/29 1043) Rubella:   RPR: NON REACTIVE (10/29 1045)  HBsAg:    HIV: Non Reactive (12/05 0931)  GBS: Negative/-- (10/14 0000)   Assessment/Plan: IUP at 38 4/7 Early labor and nonreassuring EFM in MAU, has be cat 1 and doing well since admission to L&D Will augment with cytotec, AROM when able and possible pitocin Wants CLEA at this time.    Gothard Daniels 05/01/2023, 2:40 PM

## 2023-05-01 NOTE — MAU Note (Signed)
Molly Dawson is a 37 y.o. at [redacted]w[redacted]d here in MAU reporting: contractions are about apart.  Contractions were all day yesterday. FM. Red when she wiped. Some pressure.  Doesn't think water has broke.  Was 2 cm last wk.  Onset of complaint: yesterday Pain score: 4 Vitals:   05/01/23 0855  BP: 115/73  Pulse: 87  Resp: 18  Temp: 98.3 F (36.8 C)  SpO2: 100%     FHT:138 Lab orders placed from triage:

## 2023-05-01 NOTE — Anesthesia Procedure Notes (Signed)
Epidural Patient location during procedure: OB Start time: 05/01/2023 3:12 PM End time: 05/01/2023 3:20 PM  Staffing Anesthesiologist: Mariann Barter, MD Performed: anesthesiologist   Preanesthetic Checklist Completed: patient identified, IV checked, site marked, risks and benefits discussed, surgical consent, monitors and equipment checked, pre-op evaluation and timeout performed  Epidural Patient position: sitting Prep: DuraPrep Patient monitoring: heart rate, continuous pulse ox and blood pressure Approach: midline Location: L4-L5 Injection technique: LOR saline  Needle:  Needle type: Tuohy  Needle gauge: 17 G Needle length: 9 cm Catheter type: closed end flexible Catheter size: 19 Gauge Catheter at skin depth: 12 cm Test dose: negative and 1.5% lidocaine with Epi 1:200 K  Assessment Events: blood not aspirated, no cerebrospinal fluid, injection not painful, no injection resistance, no paresthesia and negative IV test  Additional Notes Reason for block:procedure for pain

## 2023-05-01 NOTE — MAU Provider Note (Signed)
History     CSN: [id number]  Arrival date and time: 05/01/23 1884   Event Date/Time   First Provider Initiated Contact with Patient 05/01/23 1015      Chief Complaint  Patient presents with   Contractions   HPI Molly Dawson is a 36 y.o. [id number] at [redacted]w[redacted]d who presents with complaint of contractions. Regular ctx every 5 minutes. More intense ctx at this time. Had noticed ?blood with wiping. No LOF. Having good FM.   Past Medical History:  Diagnosis Date   Anxiety    Eczema     Past Surgical History:  Procedure Laterality Date   NO PAST SURGERIES      Family History  Problem Relation Age of Onset   Allergic rhinitis Mother    Allergic rhinitis Father    Eczema Sister    Eczema Brother     Social History   Tobacco Use   Smoking status: Never   Smokeless tobacco: Never  Vaping Use   Vaping status: Never Used  Substance Use Topics   Alcohol use: Yes    Alcohol/week: 1.0 standard drink of alcohol    Types: 1 Glasses of wine per week    Comment: occ before pregnancy   Drug use: No    Allergies: No Known Allergies  Medications Prior to Admission  Medication Sig Dispense Refill Last Dose   Blood Pressure Monitoring (BLOOD PRESSURE KIT) DEVI 1 Device by Does not apply route once a week. (Patient not taking: Reported on 07/07/2022) 1 each 0    Prenatal 27-1 MG TABS Take 1 tablet by mouth daily. 30 tablet 12     ROS reviewed and pertinent positives and negatives as documented in HPI.  Physical Exam   Blood pressure 115/73, pulse 87, temperature 98.3 F (36.8 C), temperature source Oral, resp. rate 18, height 5\' 3"  (1.6 m), weight 111.7 kg, SpO2 100%, unknown if currently breastfeeding.  Physical Exam Exam conducted with a chaperone present.  Constitutional:      General: She is not in acute distress.    Appearance: Normal appearance. She is not ill-appearing.  HENT:     Head: Normocephalic and atraumatic.  Cardiovascular:     Rate and Rhythm: Normal rate.   Pulmonary:     Effort: Pulmonary effort is normal.     Breath sounds: Normal breath sounds.  Abdominal:     Palpations: Abdomen is soft.     Tenderness: There is no abdominal tenderness. There is no guarding.  Musculoskeletal:        General: Normal range of motion.  Skin:    General: Skin is warm and dry.     Findings: No rash.  Neurological:     General: No focal deficit present.     Mental Status: She is alert and oriented to person, place, and time.   Dilation: 1.5 Effacement (%): Thick Presentation: Vertex (confirmed by BSUS) Exam by:: Dr. Lucianne Muss   MAU Course  Procedures  MDM 37 y.o. [id number] at [redacted]w[redacted]d who presents with c/o regular ctx. 2cm in office, closer to 1.5cm on my and bedside RN, Holly, exam.Called into room due to prolonged deceleration with FHR into the 60s-70s. Recovered with positional change to hands and knees. [place], CNM, reached out to Dr. Rana Snare while I was in the room with patient and RN. Plan is for admission to labor and delivery.   Assessment and Plan  Contractions in setting of [redacted] weeks gestation  Prolonged deceleration: Plan for admit to labor and  delivery.  Sundra Aland 05/01/2023, 10:34 AM

## 2023-05-02 ENCOUNTER — Encounter (HOSPITAL_COMMUNITY)
Admission: AD | Disposition: A | Discharge status: Home / Self Care | Payer: Self-pay | Source: Home / Self Care | Attending: Obstetrics and Gynecology

## 2023-05-02 ENCOUNTER — Encounter (HOSPITAL_COMMUNITY): Payer: Self-pay | Admitting: Obstetrics and Gynecology

## 2023-05-02 ENCOUNTER — Encounter: Payer: Self-pay | Admitting: Oncology

## 2023-05-02 ENCOUNTER — Other Ambulatory Visit: Payer: Self-pay

## 2023-05-02 DIAGNOSIS — Z3A38 38 weeks gestation of pregnancy: Secondary | ICD-10-CM

## 2023-05-02 SURGERY — Surgical Case
Anesthesia: Epidural

## 2023-05-02 MED ORDER — LACTATED RINGERS IV SOLN: INTRAVENOUS | Status: DC | PRN

## 2023-05-02 MED ORDER — COCONUT OIL OIL: 1.0000 | TOPICAL_OIL | Status: DC | PRN

## 2023-05-02 MED ORDER — IBUPROFEN 600 MG PO TABS
600.0000 mg | ORAL_TABLET | Freq: Four times a day (QID) | ORAL | Status: DC
  Administered 2023-05-03 – 2023-05-04 (×5): 600 mg via ORAL
  Filled 2023-05-02 (×5): qty 1

## 2023-05-02 MED ORDER — KETOROLAC TROMETHAMINE 30 MG/ML IJ SOLN: 30.0000 mg | Freq: Four times a day (QID) | INTRAMUSCULAR | Status: DC | PRN

## 2023-05-02 MED ORDER — MENTHOL 3 MG MT LOZG: 1.0000 | LOZENGE | OROMUCOSAL | Status: DC | PRN

## 2023-05-02 MED ORDER — NALOXONE HCL 0.4 MG/ML IJ SOLN: 0.4000 mg | INTRAMUSCULAR | Status: DC | PRN

## 2023-05-02 MED ORDER — FENTANYL CITRATE (PF) 100 MCG/2ML IJ SOLN
INTRAMUSCULAR | Status: AC
  Filled 2023-05-02: qty 2

## 2023-05-02 MED ORDER — DEXMEDETOMIDINE HCL IN NACL 80 MCG/20ML IV SOLN
INTRAVENOUS | Status: DC | PRN
  Administered 2023-05-02 (×2): 8 ug via INTRAVENOUS

## 2023-05-02 MED ORDER — KETOROLAC TROMETHAMINE 30 MG/ML IJ SOLN
30.0000 mg | Freq: Four times a day (QID) | INTRAMUSCULAR | Status: AC
  Administered 2023-05-02 – 2023-05-03 (×4): 30 mg via INTRAVENOUS
  Filled 2023-05-02 (×4): qty 1

## 2023-05-02 MED ORDER — ACETAMINOPHEN 10 MG/ML IV SOLN: 1000.0000 mg | Freq: Once | INTRAVENOUS | Status: DC | PRN

## 2023-05-02 MED ORDER — TRANEXAMIC ACID-NACL 1000-0.7 MG/100ML-% IV SOLN
INTRAVENOUS | Status: DC | PRN
  Administered 2023-05-02: 1000 mg via INTRAVENOUS

## 2023-05-02 MED ORDER — FENTANYL CITRATE (PF) 100 MCG/2ML IJ SOLN: INTRAMUSCULAR | Status: DC | PRN

## 2023-05-02 MED ORDER — ACETAMINOPHEN 160 MG/5ML PO SOLN: 325.0000 mg | ORAL | Status: DC | PRN

## 2023-05-02 MED ORDER — PHENYLEPHRINE HCL-NACL 20-0.9 MG/250ML-% IV SOLN
INTRAVENOUS | Status: DC | PRN
  Administered 2023-05-02: 80 ug via INTRAVENOUS

## 2023-05-02 MED ORDER — ACETAMINOPHEN 325 MG PO TABS: 325.0000 mg | ORAL_TABLET | ORAL | Status: DC | PRN

## 2023-05-02 MED ORDER — MORPHINE SULFATE (PF) 0.5 MG/ML IJ SOLN
INTRAMUSCULAR | Status: AC
  Filled 2023-05-02: qty 10

## 2023-05-02 MED ORDER — SODIUM CHLORIDE 0.9% FLUSH: 3.0000 mL | INTRAVENOUS | Status: DC | PRN

## 2023-05-02 MED ORDER — OXYCODONE-ACETAMINOPHEN 5-325 MG PO TABS
1.0000 | ORAL_TABLET | ORAL | Status: DC | PRN
Start: 2023-05-02 — End: 2023-05-04
  Administered 2023-05-03 – 2023-05-04 (×3): 1 via ORAL
  Filled 2023-05-02 (×3): qty 1

## 2023-05-02 MED ORDER — SCOPOLAMINE 1 MG/3DAYS TD PT72: 1.0000 | MEDICATED_PATCH | Freq: Once | TRANSDERMAL | Status: DC

## 2023-05-02 MED ORDER — NALOXONE HCL 4 MG/10ML IJ SOLN: 1.0000 ug/kg/h | INTRAVENOUS | Status: DC | PRN

## 2023-05-02 MED ORDER — SODIUM CHLORIDE 0.9 % IV SOLN
INTRAVENOUS | Status: AC
  Filled 2023-05-02: qty 2

## 2023-05-02 MED ORDER — SENNOSIDES-DOCUSATE SODIUM 8.6-50 MG PO TABS
2.0000 | ORAL_TABLET | ORAL | Status: DC
Start: 2023-05-02 — End: 2023-05-04
  Administered 2023-05-02 – 2023-05-04 (×3): 2 via ORAL
  Filled 2023-05-02 (×3): qty 2

## 2023-05-02 MED ORDER — MEASLES, MUMPS & RUBELLA VAC IJ SOLR: 0.5000 mL | Freq: Once | INTRAMUSCULAR | Status: DC

## 2023-05-02 MED ORDER — OXYTOCIN-SODIUM CHLORIDE 30-0.9 UT/500ML-% IV SOLN: 2.5000 [IU]/h | INTRAVENOUS | Status: DC

## 2023-05-02 MED ORDER — DIBUCAINE (PERIANAL) 1 % EX OINT: 1.0000 | TOPICAL_OINTMENT | CUTANEOUS | Status: DC | PRN

## 2023-05-02 MED ORDER — PRENATAL MULTIVITAMIN CH
1.0000 | ORAL_TABLET | Freq: Every day | ORAL | Status: DC
  Administered 2023-05-02 – 2023-05-04 (×3): 1 via ORAL
  Filled 2023-05-02 (×3): qty 1

## 2023-05-02 MED ORDER — WITCH HAZEL-GLYCERIN EX PADS: 1.0000 | MEDICATED_PAD | CUTANEOUS | Status: DC | PRN

## 2023-05-02 MED ORDER — TETANUS-DIPHTH-ACELL PERTUSSIS 5-2.5-18.5 LF-MCG/0.5 IM SUSY: 0.5000 mL | PREFILLED_SYRINGE | Freq: Once | INTRAMUSCULAR | Status: DC

## 2023-05-02 MED ORDER — SODIUM CHLORIDE 0.9 % IV SOLN
500.0000 mg | INTRAVENOUS | Status: AC
  Administered 2023-05-02: 500 mg via INTRAVENOUS
  Filled 2023-05-02: qty 5

## 2023-05-02 MED ORDER — FENTANYL CITRATE (PF) 100 MCG/2ML IJ SOLN
INTRAMUSCULAR | Status: DC | PRN
  Administered 2023-05-02: 50 ug via EPIDURAL
  Administered 2023-05-02: 100 ug via EPIDURAL
  Administered 2023-05-02: 50 ug via EPIDURAL

## 2023-05-02 MED ORDER — DIPHENHYDRAMINE HCL 50 MG/ML IJ SOLN
12.5000 mg | INTRAMUSCULAR | Status: DC | PRN
  Administered 2023-05-02: 12.5 mg via INTRAVENOUS
  Filled 2023-05-02: qty 1

## 2023-05-02 MED ORDER — MORPHINE SULFATE (PF) 0.5 MG/ML IJ SOLN
INTRAMUSCULAR | Status: DC | PRN
  Administered 2023-05-02: 3 mg via EPIDURAL

## 2023-05-02 MED ORDER — ACETAMINOPHEN 325 MG PO TABS
650.0000 mg | ORAL_TABLET | ORAL | Status: DC | PRN
  Administered 2023-05-03 (×2): 650 mg via ORAL
  Filled 2023-05-02 (×2): qty 2

## 2023-05-02 MED ORDER — ACETAMINOPHEN 10 MG/ML IV SOLN
INTRAVENOUS | Status: DC | PRN
  Administered 2023-05-02: 1000 mg via INTRAVENOUS

## 2023-05-02 MED ORDER — ONDANSETRON HCL 4 MG/2ML IJ SOLN
INTRAMUSCULAR | Status: DC | PRN
  Administered 2023-05-02: 4 mg via INTRAVENOUS

## 2023-05-02 MED ORDER — ZOLPIDEM TARTRATE 5 MG PO TABS: 5.0000 mg | ORAL_TABLET | Freq: Every evening | ORAL | Status: DC | PRN

## 2023-05-02 MED ORDER — SIMETHICONE 80 MG PO CHEW
80.0000 mg | CHEWABLE_TABLET | Freq: Three times a day (TID) | ORAL | Status: DC
  Administered 2023-05-02 – 2023-05-04 (×7): 80 mg via ORAL
  Filled 2023-05-02 (×7): qty 1

## 2023-05-02 MED ORDER — DIPHENHYDRAMINE HCL 25 MG PO CAPS
25.0000 mg | ORAL_CAPSULE | Freq: Four times a day (QID) | ORAL | Status: DC | PRN
  Filled 2023-05-02: qty 1

## 2023-05-02 MED ORDER — DIPHENHYDRAMINE HCL 25 MG PO CAPS
25.0000 mg | ORAL_CAPSULE | ORAL | Status: DC | PRN
  Administered 2023-05-02 – 2023-05-03 (×2): 25 mg via ORAL
  Filled 2023-05-02: qty 1

## 2023-05-02 MED ORDER — SODIUM CHLORIDE 0.9 % IV SOLN: 2.0000 g | Freq: Once | INTRAVENOUS | Status: DC

## 2023-05-02 MED ORDER — SIMETHICONE 80 MG PO CHEW: 80.0000 mg | CHEWABLE_TABLET | ORAL | Status: DC | PRN

## 2023-05-02 MED ORDER — SODIUM CHLORIDE 0.9 % IV SOLN
INTRAVENOUS | Status: DC | PRN
  Administered 2023-05-02: 2 g via INTRAVENOUS

## 2023-05-02 MED ORDER — ONDANSETRON HCL 4 MG/2ML IJ SOLN: 4.0000 mg | Freq: Three times a day (TID) | INTRAMUSCULAR | Status: DC | PRN

## 2023-05-02 MED ORDER — FENTANYL CITRATE (PF) 100 MCG/2ML IJ SOLN: 25.0000 ug | INTRAMUSCULAR | Status: DC | PRN

## 2023-05-02 MED ORDER — OXYTOCIN-SODIUM CHLORIDE 30-0.9 UT/500ML-% IV SOLN
INTRAVENOUS | Status: DC | PRN
  Administered 2023-05-02: 300 mL via INTRAVENOUS

## 2023-05-02 MED ORDER — DEXAMETHASONE SODIUM PHOSPHATE 10 MG/ML IJ SOLN
INTRAMUSCULAR | Status: DC | PRN
  Administered 2023-05-02: 10 mg via INTRAVENOUS

## 2023-05-02 SURGICAL SUPPLY — 30 items
APL PRP STRL LF DISP 70% ISPRP (MISCELLANEOUS) ×2
APL SKNCLS STERI-STRIP NONHPOA (GAUZE/BANDAGES/DRESSINGS) ×1
BENZOIN TINCTURE PRP APPL 2/3 (GAUZE/BANDAGES/DRESSINGS) IMPLANT
CHLORAPREP W/TINT 26 (MISCELLANEOUS) ×2 IMPLANT
CLAMP UMBILICAL CORD (MISCELLANEOUS) ×1 IMPLANT
CLIP FILSHIE TUBAL LIGA STRL (Clip) IMPLANT
CLOTH BEACON ORANGE TIMEOUT ST (SAFETY) ×1 IMPLANT
DRSG OPSITE POSTOP 4X10 (GAUZE/BANDAGES/DRESSINGS) ×1 IMPLANT
ELECT REM PT RETURN 9FT ADLT (ELECTROSURGICAL) ×1
ELECTRODE REM PT RTRN 9FT ADLT (ELECTROSURGICAL) ×1 IMPLANT
EXTRACTOR VACUUM M CUP 4 TUBE (SUCTIONS) IMPLANT
GAUZE SPONGE 4X4 12PLY STRL LF (GAUZE/BANDAGES/DRESSINGS) IMPLANT
GLOVE BIOGEL PI IND STRL 7.0 (GLOVE) ×1 IMPLANT
GLOVE SURG ORTHO 8.0 STRL STRW (GLOVE) ×1 IMPLANT
GOWN STRL REUS W/TWL LRG LVL3 (GOWN DISPOSABLE) ×2 IMPLANT
KIT ABG SYR 3ML LUER SLIP (SYRINGE) ×1 IMPLANT
MAT PREVALON FULL STRYKER (MISCELLANEOUS) IMPLANT
NDL HYPO 25X5/8 SAFETYGLIDE (NEEDLE) ×1 IMPLANT
NEEDLE HYPO 25X5/8 SAFETYGLIDE (NEEDLE) ×1 IMPLANT
NS IRRIG 1000ML POUR BTL (IV SOLUTION) ×1 IMPLANT
PACK C SECTION WH (CUSTOM PROCEDURE TRAY) ×1 IMPLANT
PAD OB MATERNITY 4.3X12.25 (PERSONAL CARE ITEMS) ×1 IMPLANT
SUT MNCRL 0 VIOLET CTX 36 (SUTURE) ×3 IMPLANT
SUT MON AB 4-0 PS1 27 (SUTURE) ×1 IMPLANT
SUT PDS AB 0 CTX 60 (SUTURE) IMPLANT
SUT VIC AB 1 CTX 36 (SUTURE)
SUT VIC AB 1 CTX36XBRD ANBCTRL (SUTURE) IMPLANT
TOWEL OR 17X24 6PK STRL BLUE (TOWEL DISPOSABLE) ×1 IMPLANT
TRAY FOLEY W/BAG SLVR 14FR LF (SET/KITS/TRAYS/PACK) ×1 IMPLANT
WATER STERILE IRR 1000ML POUR (IV SOLUTION) ×1 IMPLANT

## 2023-05-02 NOTE — Progress Notes (Signed)
Patient ID: Molly Dawson, female   DOB: 22-Feb-1986, 37 y.o.   MRN: [id number] CTSP after several episodes ore recurrent late decels, and prolonged decels which have responded to position change and d/c of pitocin Currently, there are accels and moderate variability There also has been no cx change since midnight due to OP presentation, despite position changes and adequate labor pattern, now with pitocin off due to fetal intolerance to labor Rec cesarean section, due to fetal intolerance to labor, OP presentation,and arrest of dilation Risks and benefits of C/S were discussed.  All questions were answered and informed consent was obtained.  Plan to proceed with low segment transverse Cesarean Section.

## 2023-05-02 NOTE — Anesthesia Postprocedure Evaluation (Signed)
Anesthesia Post Note  Patient: Molly Dawson  Procedure(s) Performed: CESAREAN SECTION     Patient location during evaluation: PACU Anesthesia Type: Epidural Level of consciousness: oriented and awake and alert Pain management: pain level controlled Vital Signs Assessment: post-procedure vital signs reviewed and stable Respiratory status: spontaneous breathing, respiratory function stable and patient connected to nasal cannula oxygen Cardiovascular status: blood pressure returned to baseline and stable Postop Assessment: no headache, no backache, no apparent nausea or vomiting and epidural receding Anesthetic complications: no  No notable events documented.  Last Vitals:  Vitals:   05/02/23 1010 05/02/23 1117  BP: 111/71 102/70  Pulse: 98 86  Resp: 20   Temp: 36.7 C   SpO2: 100% 98%    Last Pain:  Vitals:   05/02/23 1010  TempSrc: Oral  PainSc: 0-No pain   Pain Goal:                   Shelton Silvas

## 2023-05-02 NOTE — Op Note (Signed)
Cesarean Section Procedure Note  Pre-operative Diagnosis: IUP at 38 weeks, Fetal intolerance to labor, meconium, OP presentation  Post-operative Diagnosis: same  Surgeon: Liss Daniels   Assistants: Steward Drone, surg tech  Anesthesia: epidural  Procedure:  Low Segment Transverse cesarean section  Procedure Details  The patient was seen in the Holding Room. The risks, benefits, complications, treatment options, and expected outcomes were discussed with the patient.  The patient concurred with the proposed plan, giving informed consent.  The site of surgery properly noted/marked.. A Time Out was held and the above information confirmed.  After induction of anesthesia, the patient was draped and prepped in the usual sterile manner. A Pfannenstiel incision was made and carried down through the subcutaneous tissue to the fascia. Fascial incision was made and extended transversely. The fascia was separated from the underlying rectus tissue superiorly and inferiorly. The peritoneum was identified and entered. Peritoneal incision was extended longitudinally. The utero-vesical peritoneal reflection was incised transversely and the bladder flap was bluntly freed from the lower uterine segment. A low transverse uterine incision was made. Delivered from OP presentation was a baby with Apgar scores of  not assigned yet  at one minute and  not assign yet  at five minutes. After the umbilical cord was clamped and cut cord blood was obtained for evaluation. The placenta was removed intact and appeared normal. The uterine outline, tubes and ovaries appeared normal. The uterine incision was closed with running locked sutures of 0 monocryl and imbricated with 0 monocryl. Hemostasis was observed. Lavage was carried out until clear. The peritoneum was then closed with 0 monocryl and rectus muscles plicated in the midline.  After hemostasis was assured, the fascia was then reapproximated with running sutures of 0 Vicryl.  Irrigation was applied and after adequate hemostasis was assured, the skin was reapproximated with subcutaneous sutures using 4-0 monocryl.  Instrument, sponge, and needle counts were correct prior the abdominal closure and at the conclusion of the case. The patient received 2 grams cefotetan preoperatively.  Findings: Viable  female subserosal 3cm fibroid anterior surface  Estimated Blood Loss:  1114cc         Specimens: Placenta was sent to labor and delivery         Complications:  None

## 2023-05-02 NOTE — Anesthesia Preprocedure Evaluation (Addendum)
Anesthesia Evaluation  Patient identified by MRN, date of birth, ID band Patient awake    Reviewed: Allergy & Precautions, NPO status , Patient's Chart, lab work & pertinent test results, reviewed documented beta blocker date and time   History of Anesthesia Complications Negative for: history of anesthetic complications  Airway Mallampati: III  TM Distance: >3 FB     Dental no notable dental hx.    Pulmonary neg COPD   breath sounds clear to auscultation       Cardiovascular (-) angina (-) CAD  Rhythm:Regular Rate:Normal     Neuro/Psych neg Seizures  Anxiety        GI/Hepatic   Endo/Other    Renal/GU      Musculoskeletal   Abdominal   Peds  Hematology  (+) Blood dyscrasia, anemia   Anesthesia Other Findings   Reproductive/Obstetrics                             Anesthesia Physical Anesthesia Plan  ASA: 2  Anesthesia Plan: Epidural   Post-op Pain Management:    Induction: Intravenous  PONV Risk Score and Plan: 2 and Ondansetron  Airway Management Planned:   Additional Equipment:   Intra-op Plan:   Post-operative Plan:   Informed Consent:      Dental advisory given  Plan Discussed with: CRNA  Anesthesia Plan Comments:        Anesthesia Quick Evaluation

## 2023-05-02 NOTE — Transfer of Care (Signed)
Immediate Anesthesia Transfer of Care Note  Patient: Molly Dawson  Procedure(s) Performed: CESAREAN SECTION  Patient Location: PACU  Anesthesia Type:Epidural  Level of Consciousness: awake, alert , and oriented  Airway & Oxygen Therapy: Patient Spontanous Breathing  Post-op Assessment: Report given to RN and Post -op Vital signs reviewed and stable  Post vital signs: Reviewed and stable  Last Vitals:  Vitals Value Taken Time  BP 102/53 05/02/23 0800  Temp    Pulse 116 05/02/23 0807  Resp 18 05/02/23 0806  SpO2 100 % 05/02/23 0807  Vitals shown include unfiled device data.  Last Pain:  Vitals:   05/02/23 0600  TempSrc:   PainSc: 0-No pain         Complications: No notable events documented.

## 2023-05-03 LAB — CBC
HCT: 26.2 % — ABNORMAL LOW (ref 36.0–46.0)
Hemoglobin: 9.3 g/dL — ABNORMAL LOW (ref 12.0–15.0)
MCH: 26.1 pg (ref 26.0–34.0)
MCHC: 35.5 g/dL (ref 30.0–36.0)
MCV: 73.6 fL — ABNORMAL LOW (ref 80.0–100.0)
Platelets: 205 10*3/uL (ref 150–400)
RBC: 3.56 MIL/uL — ABNORMAL LOW (ref 3.87–5.11)
RDW: 21.1 % — ABNORMAL HIGH (ref 11.5–15.5)
WBC: 22.8 10*3/uL — ABNORMAL HIGH (ref 4.0–10.5)
nRBC: 0 % (ref 0.0–0.2)

## 2023-05-03 MED ORDER — FERROUS SULFATE 325 (65 FE) MG PO TABS
325.0000 mg | ORAL_TABLET | Freq: Every day | ORAL | Status: DC
  Administered 2023-05-03 – 2023-05-04 (×2): 325 mg via ORAL
  Filled 2023-05-03 (×2): qty 1

## 2023-05-03 MED ORDER — AMMONIA AROMATIC IN INHA
RESPIRATORY_TRACT | Status: AC
  Filled 2023-05-03: qty 10

## 2023-05-03 NOTE — Progress Notes (Signed)
Postpartum Progress Note  S: Feeling well. Having some incisional pain that is overall well-managed with medications. Lochia appropriate. No subjective fevers/chills, Cp, or SOB. Ambulating.   O:     05/03/2023    5:00 AM 05/03/2023    1:00 AM 05/02/2023    8:56 PM  Vitals with BMI  Systolic 96 99 99  Diastolic 64 61 65  Pulse 85 100 99    Gen: NAD, A&O Pulm: NWOB Abd: soft, appropriately ttp, fundus firm and below Umb. Incision c/d under honeycomb, small amount of strikethrough marked. Ext: No evidence of DVT, trace edema b/l  UOP adequate.   Labs Recent Results (from the past 2160 hour(s))  Folic Acid     Status: None   Collection Time: 02/27/23  2:12 PM  Result Value Ref Range   Folate 19.2 >5.9 ng/mL    Comment: Performed at [facility], [address] [place], [place] [address]  Vitamin B12     Status: None   Collection Time: 02/27/23  2:12 PM  Result Value Ref Range   Vitamin B-12 315 180 - 914 pg/mL    Comment: (NOTE) This assay is not validated for testing neonatal or myeloproliferative syndrome specimens for Vitamin B12 levels. Performed at [facility], [address] [place], [place] [address]   Ferritin     Status: Abnormal   Collection Time: 02/27/23  2:12 PM  Result Value Ref Range   Ferritin 4 (L) 11 - 307 ng/mL    Comment: Performed at [facility], [address] [place], [place] [address]  Iron and TIBC     Status: Abnormal   Collection Time: 02/27/23  2:12 PM  Result Value Ref Range   Iron 55 28 - 170 ug/dL   TIBC 829 (H) 562 - 130 ug/dL   Saturation Ratios 10 (L) 10.4 - 31.8 %   UIBC 519 ug/dL    Comment: Performed at [facility], [address] [place], [place] [address]  CBC (Cancer Center Only)     Status: Abnormal   Collection Time: 02/27/23  2:12 PM  Result Value Ref Range   WBC Count 12.1 (H) 4.0 - 10.5 K/uL   RBC 3.87 3.87 - 5.11 MIL/uL   Hemoglobin 8.7 (L) 12.0 - 15.0 g/dL    Comment:  Reticulocyte Hemoglobin testing may be clinically indicated, consider ordering this additional test [id number]    HCT 26.2 (L) 36.0 - 46.0 %   MCV 67.7 (L) 80.0 - 100.0 fL   MCH 22.5 (L) 26.0 - 34.0 pg   MCHC 33.2 30.0 - 36.0 g/dL   RDW 84.1 (H) 32.4 - 40.1 %   Platelet Count 350 150 - 400 K/uL   nRBC 0.0 0.0 - 0.2 %    Comment: Performed at [facility], [address] [place], [place] [address]  OB RESULT CONSOLE Group B Strep     Status: None   Collection Time: 04/16/23 12:00 AM  Result Value Ref Range   GBS Negative   Wet prep, genital     Status: Abnormal   Collection Time: 05/01/23  9:40 AM  Result Value Ref Range   Yeast Wet Prep HPF POC NONE SEEN NONE SEEN   Trich, Wet Prep NONE SEEN NONE SEEN   Clue Cells Wet Prep HPF POC PRESENT (A) NONE SEEN   WBC, Wet Prep HPF POC <10 <10   Sperm NONE SEEN     Comment: Performed at [facility], [address], [place], [place] [address]  Type and screen [facility]     Status: None   Collection Time: 05/01/23 10:43 AM  Result Value Ref Range   ABO/RH(D) O POS    Antibody Screen NEG    Sample Expiration      05/04/2023,2359 Performed at [facility], [address] [place], [place] [address]   CBC     Status: Abnormal   Collection Time: 05/01/23 10:45 AM  Result Value Ref Range   WBC 12.7 (H) 4.0 - 10.5 K/uL   RBC 4.80 3.87 - 5.11 MIL/uL   Hemoglobin 12.2 12.0 - 15.0 g/dL   HCT 09.8 (L) 11.9 - 14.7 %   MCV 74.0 (L) 80.0 - 100.0 fL   MCH 25.4 (L) 26.0 - 34.0 pg   MCHC 34.4 30.0 - 36.0 g/dL   RDW 82.9 (H) 56.2 - 13.0 %   Platelets 231 150 - 400 K/uL    Comment: REPEATED TO VERIFY   nRBC 0.0 0.0 - 0.2 %    Comment: Performed at [facility], [address], [place], [place] [address]  RPR     Status: None   Collection Time: 05/01/23 10:45 AM  Result Value Ref Range   RPR Ser Ql NON REACTIVE NON REACTIVE    Comment: Performed at [facility], [address],  [place], [place] [address]  CBC     Status: Abnormal   Collection Time: 05/03/23  3:35 AM  Result Value Ref Range   WBC 22.8 (H) 4.0 - 10.5 K/uL   RBC 3.56 (L) 3.87 - 5.11 MIL/uL   Hemoglobin 9.3 (L) 12.0 - 15.0 g/dL   HCT 95.2 (L) 84.1 - 32.4 %   MCV 73.6 (L) 80.0 - 100.0 fL   MCH 26.1 26.0 - 34.0 pg   MCHC 35.5 30.0 - 36.0 g/dL   RDW 40.1 (H) 02.7 - 25.3 %   Platelets 205 150 - 400 K/uL    Comment: REPEATED TO VERIFY   nRBC 0.0 0.0 - 0.2 %    Comment: Performed at [facility], [address], [place], [place] [address]     A/P:  POD1 s/p pCS, doing well pp. AFVSS. Benign exam. Had I&O catheterization overnight for 500ccs, just voided spontaneously Acute blood loss anemia, clinically significant - will start PO iron supplementation. Counseled re circumcision for baby boy - r/b/a reviewed. Will perform today. Continue present care.   Jule Economy, MD

## 2023-05-03 NOTE — Social Work (Signed)
MOB was referred for history of depression/anxiety.  * Referral screened out by Clinical Social Worker because none of the following criteria appear to apply:  ~ History of anxiety/depression during this pregnancy, or of post-partum depression following prior delivery.  ~ Diagnosis of anxiety and/or depression within last 3 years OR * MOB's symptoms currently being treated with medication and/or therapy.  Per chart review diagnosis was prior October 2021, no MH concerns noted during this pregnancy.  Please contact the Clinical Social Worker if needs arise, or by MOB request.  Wende Neighbors, LCSWA Clinical Social Worker [phone]

## 2023-05-04 MED ORDER — ACETAMINOPHEN 325 MG PO TABS: 650.0000 mg | ORAL_TABLET | Freq: Four times a day (QID) | ORAL | 0 refills | Status: AC | PRN

## 2023-05-04 MED ORDER — IBUPROFEN 600 MG PO TABS: 600.0000 mg | ORAL_TABLET | Freq: Four times a day (QID) | ORAL | 0 refills | Status: AC | PRN

## 2023-05-04 MED ORDER — OXYCODONE HCL 5 MG PO TABS
5.0000 mg | ORAL_TABLET | Freq: Four times a day (QID) | ORAL | 0 refills | Status: AC | PRN
Start: 2023-05-04 — End: ?

## 2023-05-04 MED ORDER — FERROUS SULFATE 325 (65 FE) MG PO TABS: 325.0000 mg | ORAL_TABLET | Freq: Every day | ORAL | 3 refills | Status: AC

## 2023-05-04 NOTE — Discharge Summary (Signed)
Postpartum Discharge Summary  Date of Service updated 05/04/23     Patient Name: Molly Dawson DOB: March 05, 1986 MRN: [id number]  Date of admission: 05/01/2023 Delivery date:05/02/2023 Delivering provider: Candice Camp Date of discharge: 05/04/2023  Admitting diagnosis: Normal labor and delivery [O80] Intrauterine pregnancy: [redacted]w[redacted]d     Secondary diagnosis:  Principal Problem:   Normal labor and delivery  Additional problems:     Discharge diagnosis: Term Pregnancy Delivered                                              Post partum procedures: Augmentation: AROM, Pitocin, and Cytotec Complications: None  Hospital course: Onset of Labor With Unplanned C/S   37 y.o. yo [id number] at [redacted]w[redacted]d was admitted in Latent Labor on 05/01/2023. Patient had a labor course significant for non reassuring FHT. The patient went for cesarean section due to Non-Reassuring FHR. Delivery details as follows: Membrane Rupture Time/Date: 3:36 PM,05/01/2023  Delivery Method:C-Section, Low Transverse Operative Delivery: Details of operation can be found in separate operative note. Patient had a postpartum course complicated by.  She is ambulating,tolerating a regular diet, passing flatus, and urinating well.  Patient is discharged home in stable condition 05/04/23.  Newborn Data: Birth date:05/02/2023 Birth time:7:14 AM Gender:Female Living status:Living Apgars:5 ,9  Weight:3120 g  Magnesium Sulfate received: No BMZ received: No Rhophylac:No MMR:No T-DaP:Given prenatally Flu: No RSV Vaccine received: No Transfusion:No Immunizations administered: There is no immunization history for the selected administration types on file for this patient.  Physical exam  Vitals:   05/03/23 0500 05/03/23 1456 05/03/23 2018 05/04/23 0401  BP: 96/64 110/63 109/70 102/82  Pulse: 85 100 100 (!) 126  Resp: 20 19 18 18   Temp: 98.2 F (36.8 C) 98.5 F (36.9 C)  99 F (37.2 C)  TempSrc: Oral Oral    SpO2:  100%     Weight:      Height:       General: alert, cooperative, and no distress Lochia: appropriate Uterine Fundus: firm Incision: Healing well with no significant drainage DVT Evaluation: No evidence of DVT seen on physical exam. Labs: Lab Results  Component Value Date   WBC 22.8 (H) 05/03/2023   HGB 9.3 (L) 05/03/2023   HCT 26.2 (L) 05/03/2023   MCV 73.6 (L) 05/03/2023   PLT 205 05/03/2023       No data to display         Edinburgh Score:    05/03/2023    5:40 AM  Edinburgh Postnatal Depression Scale Screening Tool  I have been able to laugh and see the funny side of things. 0  I have looked forward with enjoyment to things. 1  I have blamed myself unnecessarily when things went wrong. 1  I have been anxious or worried for no good reason. 0  I have felt scared or panicky for no good reason. 0  Things have been getting on top of me. 1  I have been so unhappy that I have had difficulty sleeping. 0  I have felt sad or miserable. 1  I have been so unhappy that I have been crying. 1  The thought of harming myself has occurred to me. 0  Edinburgh Postnatal Depression Scale Total 5      After visit meds:     Discharge home in stable condition Infant Feeding: Breast Infant  Disposition:home with mother Discharge instruction: per After Visit Summary and Postpartum booklet. Activity: Advance as tolerated. Pelvic rest for 6 weeks.  Diet: routine diet Anticipated Birth Control: Unsure Postpartum Appointment:6 weeks Additional Postpartum F/U:  Future Appointments:No future appointments. Follow up Visit:      05/04/2023 Leslie Andrea, MD

## 2023-05-14 ENCOUNTER — Encounter: Payer: Self-pay | Admitting: Oncology

## 2023-05-18 ENCOUNTER — Inpatient Hospital Stay (HOSPITAL_COMMUNITY): Payer: 59

## 2023-05-18 ENCOUNTER — Inpatient Hospital Stay (HOSPITAL_COMMUNITY): Admission: RE | Admit: 2023-05-18 | Payer: 59 | Source: Home / Self Care | Admitting: Obstetrics and Gynecology

## 2023-05-19 ENCOUNTER — Telehealth (HOSPITAL_COMMUNITY): Payer: Self-pay | Admitting: *Deleted

## 2023-05-19 NOTE — Telephone Encounter (Signed)
Attempted hospital discharge follow-up call. Left message for patient to return RN call with any questions or concerns. Deforest Hoyles, RN, 05/19/23, [phone]

## 2023-05-30 ENCOUNTER — Encounter: Payer: Self-pay | Admitting: Oncology

## 2023-06-03 DIAGNOSIS — Z419 Encounter for procedure for purposes other than remedying health state, unspecified: Secondary | ICD-10-CM | POA: Diagnosis not present

## 2023-07-04 DIAGNOSIS — Z419 Encounter for procedure for purposes other than remedying health state, unspecified: Secondary | ICD-10-CM | POA: Diagnosis not present

## 2023-08-04 DIAGNOSIS — Z419 Encounter for procedure for purposes other than remedying health state, unspecified: Secondary | ICD-10-CM | POA: Diagnosis not present

## 2023-09-01 DIAGNOSIS — Z419 Encounter for procedure for purposes other than remedying health state, unspecified: Secondary | ICD-10-CM | POA: Diagnosis not present

## 2023-09-12 DIAGNOSIS — Z419 Encounter for procedure for purposes other than remedying health state, unspecified: Secondary | ICD-10-CM | POA: Diagnosis not present

## 2023-10-13 DIAGNOSIS — Z419 Encounter for procedure for purposes other than remedying health state, unspecified: Secondary | ICD-10-CM | POA: Diagnosis not present

## 2023-11-12 DIAGNOSIS — Z419 Encounter for procedure for purposes other than remedying health state, unspecified: Secondary | ICD-10-CM | POA: Diagnosis not present

## 2023-12-13 DIAGNOSIS — Z419 Encounter for procedure for purposes other than remedying health state, unspecified: Secondary | ICD-10-CM | POA: Diagnosis not present

## 2023-12-27 DIAGNOSIS — Z01419 Encounter for gynecological examination (general) (routine) without abnormal findings: Secondary | ICD-10-CM | POA: Diagnosis not present

## 2023-12-27 DIAGNOSIS — Z130 Encounter for screening for diseases of the blood and blood-forming organs and certain disorders involving the immune mechanism: Secondary | ICD-10-CM | POA: Diagnosis not present

## 2023-12-27 DIAGNOSIS — Z13220 Encounter for screening for lipoid disorders: Secondary | ICD-10-CM | POA: Diagnosis not present

## 2023-12-27 DIAGNOSIS — Z6838 Body mass index (BMI) 38.0-38.9, adult: Secondary | ICD-10-CM | POA: Diagnosis not present

## 2023-12-27 DIAGNOSIS — R319 Hematuria, unspecified: Secondary | ICD-10-CM | POA: Diagnosis not present

## 2023-12-27 DIAGNOSIS — Z13228 Encounter for screening for other metabolic disorders: Secondary | ICD-10-CM | POA: Diagnosis not present

## 2023-12-27 DIAGNOSIS — Z131 Encounter for screening for diabetes mellitus: Secondary | ICD-10-CM | POA: Diagnosis not present

## 2023-12-27 DIAGNOSIS — Z1329 Encounter for screening for other suspected endocrine disorder: Secondary | ICD-10-CM | POA: Diagnosis not present

## 2024-01-31 DIAGNOSIS — R7989 Other specified abnormal findings of blood chemistry: Secondary | ICD-10-CM | POA: Diagnosis not present
